# Patient Record
Sex: Male | Born: 1955 | Race: Black or African American | Hispanic: No | Marital: Single | Smoking: Never smoker
Health system: Southern US, Community
[De-identification: ages and names within clinical notes are randomized; demographics above are authoritative.]

## PROBLEM LIST (undated history)

## (undated) ENCOUNTER — Ambulatory Visit (HOSPITAL_COMMUNITY): Admission: EM | Payer: Self-pay

## (undated) DIAGNOSIS — E785 Hyperlipidemia, unspecified: Secondary | ICD-10-CM

## (undated) DIAGNOSIS — N186 End stage renal disease: Secondary | ICD-10-CM

## (undated) DIAGNOSIS — K449 Diaphragmatic hernia without obstruction or gangrene: Secondary | ICD-10-CM

## (undated) DIAGNOSIS — J45909 Unspecified asthma, uncomplicated: Secondary | ICD-10-CM

## (undated) DIAGNOSIS — N529 Male erectile dysfunction, unspecified: Secondary | ICD-10-CM

## (undated) DIAGNOSIS — N189 Chronic kidney disease, unspecified: Secondary | ICD-10-CM

## (undated) DIAGNOSIS — M549 Dorsalgia, unspecified: Secondary | ICD-10-CM

## (undated) DIAGNOSIS — K279 Peptic ulcer, site unspecified, unspecified as acute or chronic, without hemorrhage or perforation: Secondary | ICD-10-CM

## (undated) DIAGNOSIS — A048 Other specified bacterial intestinal infections: Secondary | ICD-10-CM

## (undated) DIAGNOSIS — I10 Essential (primary) hypertension: Secondary | ICD-10-CM

## (undated) DIAGNOSIS — D649 Anemia, unspecified: Secondary | ICD-10-CM

## (undated) DIAGNOSIS — S0285XA Fracture of orbit, unspecified, initial encounter for closed fracture: Secondary | ICD-10-CM

## (undated) HISTORY — DX: Peptic ulcer, site unspecified, unspecified as acute or chronic, without hemorrhage or perforation: K27.9

## (undated) HISTORY — DX: Other specified bacterial intestinal infections: A04.8

## (undated) HISTORY — PX: COLONOSCOPY: SHX174

## (undated) HISTORY — DX: Diaphragmatic hernia without obstruction or gangrene: K44.9

## (undated) HISTORY — DX: Male erectile dysfunction, unspecified: N52.9

## (undated) HISTORY — DX: Fracture of orbit, unspecified, initial encounter for closed fracture: S02.85XA

## (undated) HISTORY — DX: Hyperlipidemia, unspecified: E78.5

## (undated) HISTORY — DX: Unspecified asthma, uncomplicated: J45.909

## (undated) HISTORY — DX: Anemia, unspecified: D64.9

---

## 1998-03-12 ENCOUNTER — Emergency Department (HOSPITAL_COMMUNITY): Admission: EM | Admit: 1998-03-12 | Discharge: 1998-03-12 | Payer: Self-pay | Admitting: Emergency Medicine

## 1999-05-31 ENCOUNTER — Inpatient Hospital Stay (HOSPITAL_COMMUNITY): Admission: EM | Admit: 1999-05-31 | Discharge: 1999-06-01 | Payer: Self-pay | Admitting: Emergency Medicine

## 1999-05-31 ENCOUNTER — Encounter: Payer: Self-pay | Admitting: Emergency Medicine

## 2000-11-24 ENCOUNTER — Emergency Department (HOSPITAL_COMMUNITY): Admission: EM | Admit: 2000-11-24 | Discharge: 2000-11-24 | Payer: Self-pay | Admitting: Emergency Medicine

## 2001-02-10 ENCOUNTER — Encounter: Payer: Self-pay | Admitting: Emergency Medicine

## 2001-02-10 ENCOUNTER — Emergency Department (HOSPITAL_COMMUNITY): Admission: EM | Admit: 2001-02-10 | Discharge: 2001-02-10 | Payer: Self-pay | Admitting: Emergency Medicine

## 2001-09-13 ENCOUNTER — Encounter (INDEPENDENT_AMBULATORY_CARE_PROVIDER_SITE_OTHER): Payer: Self-pay | Admitting: *Deleted

## 2001-09-13 ENCOUNTER — Ambulatory Visit (HOSPITAL_COMMUNITY): Admission: RE | Admit: 2001-09-13 | Discharge: 2001-09-13 | Payer: Self-pay | Admitting: Neurosurgery

## 2001-09-13 ENCOUNTER — Encounter: Payer: Self-pay | Admitting: Neurosurgery

## 2004-04-15 ENCOUNTER — Ambulatory Visit: Payer: Self-pay | Admitting: Internal Medicine

## 2004-04-18 ENCOUNTER — Ambulatory Visit: Payer: Self-pay | Admitting: Internal Medicine

## 2004-04-27 ENCOUNTER — Ambulatory Visit: Payer: Self-pay | Admitting: *Deleted

## 2004-12-20 ENCOUNTER — Ambulatory Visit: Payer: Self-pay | Admitting: Internal Medicine

## 2005-01-03 ENCOUNTER — Emergency Department (HOSPITAL_COMMUNITY): Admission: EM | Admit: 2005-01-03 | Discharge: 2005-01-03 | Payer: Self-pay | Admitting: Emergency Medicine

## 2005-03-27 ENCOUNTER — Ambulatory Visit: Payer: Self-pay | Admitting: Internal Medicine

## 2005-04-10 ENCOUNTER — Ambulatory Visit: Payer: Self-pay | Admitting: Internal Medicine

## 2005-05-10 ENCOUNTER — Emergency Department (HOSPITAL_COMMUNITY): Admission: EM | Admit: 2005-05-10 | Discharge: 2005-05-10 | Payer: Self-pay | Admitting: Emergency Medicine

## 2005-05-10 ENCOUNTER — Ambulatory Visit: Payer: Self-pay | Admitting: Internal Medicine

## 2005-11-10 ENCOUNTER — Ambulatory Visit: Payer: Self-pay | Admitting: Internal Medicine

## 2006-07-23 ENCOUNTER — Ambulatory Visit: Payer: Self-pay | Admitting: Internal Medicine

## 2006-07-30 ENCOUNTER — Ambulatory Visit: Payer: Self-pay | Admitting: Internal Medicine

## 2006-10-10 ENCOUNTER — Ambulatory Visit: Payer: Self-pay | Admitting: Internal Medicine

## 2007-03-13 ENCOUNTER — Encounter (INDEPENDENT_AMBULATORY_CARE_PROVIDER_SITE_OTHER): Payer: Self-pay | Admitting: *Deleted

## 2007-03-26 DIAGNOSIS — E785 Hyperlipidemia, unspecified: Secondary | ICD-10-CM

## 2007-03-26 DIAGNOSIS — E118 Type 2 diabetes mellitus with unspecified complications: Secondary | ICD-10-CM

## 2008-01-21 ENCOUNTER — Ambulatory Visit: Payer: Self-pay | Admitting: Internal Medicine

## 2008-01-21 DIAGNOSIS — J45909 Unspecified asthma, uncomplicated: Secondary | ICD-10-CM | POA: Insufficient documentation

## 2008-01-21 DIAGNOSIS — R809 Proteinuria, unspecified: Secondary | ICD-10-CM | POA: Insufficient documentation

## 2008-01-21 DIAGNOSIS — I10 Essential (primary) hypertension: Secondary | ICD-10-CM | POA: Insufficient documentation

## 2008-01-21 DIAGNOSIS — E1149 Type 2 diabetes mellitus with other diabetic neurological complication: Secondary | ICD-10-CM | POA: Insufficient documentation

## 2008-01-21 LAB — CONVERTED CEMR LAB
Blood Glucose, Fingerstick: 131
Hgb A1c MFr Bld: 5.6 %

## 2008-02-05 ENCOUNTER — Ambulatory Visit: Payer: Self-pay | Admitting: Internal Medicine

## 2008-02-11 ENCOUNTER — Ambulatory Visit: Payer: Self-pay | Admitting: Internal Medicine

## 2008-02-18 LAB — CONVERTED CEMR LAB
BUN: 33 mg/dL — ABNORMAL HIGH (ref 6–23)
CO2: 20 meq/L (ref 19–32)
Calcium: 9.4 mg/dL (ref 8.4–10.5)
Chloride: 107 meq/L (ref 96–112)
Cholesterol: 147 mg/dL (ref 0–200)
Creatinine, Ser: 1.86 mg/dL — ABNORMAL HIGH (ref 0.40–1.50)
Glucose, Bld: 76 mg/dL (ref 70–99)
HDL: 52 mg/dL (ref >39)
Hemoglobin: 11.3 g/dL — ABNORMAL LOW (ref 13.0–17.0)
Lymphocytes Relative: 26 % (ref 12–46)
Monocytes Absolute: 0.6 10*3/uL (ref 0.1–1.0)
Monocytes Relative: 11 % (ref 3–12)
Neutro Abs: 3.1 10*3/uL (ref 1.7–7.7)
RBC: 4.08 M/uL — ABNORMAL LOW (ref 4.22–5.81)
Total CHOL/HDL Ratio: 2.8
Triglycerides: 62 mg/dL (ref <150)

## 2008-02-24 ENCOUNTER — Ambulatory Visit: Payer: Self-pay | Admitting: Internal Medicine

## 2008-02-25 ENCOUNTER — Encounter (INDEPENDENT_AMBULATORY_CARE_PROVIDER_SITE_OTHER): Payer: Self-pay | Admitting: Internal Medicine

## 2008-03-11 ENCOUNTER — Encounter (INDEPENDENT_AMBULATORY_CARE_PROVIDER_SITE_OTHER): Payer: Self-pay | Admitting: Internal Medicine

## 2008-03-11 DIAGNOSIS — D509 Iron deficiency anemia, unspecified: Secondary | ICD-10-CM

## 2008-03-11 LAB — CONVERTED CEMR LAB
Iron: 53 ug/dL (ref 42–165)
Saturation Ratios: 12 % — ABNORMAL LOW (ref 20–55)
Vitamin B-12: 351 pg/mL (ref 211–911)

## 2008-04-19 ENCOUNTER — Telehealth (INDEPENDENT_AMBULATORY_CARE_PROVIDER_SITE_OTHER): Payer: Self-pay | Admitting: Internal Medicine

## 2008-07-02 ENCOUNTER — Emergency Department (HOSPITAL_COMMUNITY): Admission: EM | Admit: 2008-07-02 | Discharge: 2008-07-03 | Payer: Self-pay | Admitting: Emergency Medicine

## 2008-07-24 ENCOUNTER — Encounter (INDEPENDENT_AMBULATORY_CARE_PROVIDER_SITE_OTHER): Payer: Self-pay | Admitting: Internal Medicine

## 2008-08-09 ENCOUNTER — Encounter (INDEPENDENT_AMBULATORY_CARE_PROVIDER_SITE_OTHER): Payer: Self-pay | Admitting: Internal Medicine

## 2009-05-12 ENCOUNTER — Ambulatory Visit: Payer: Self-pay | Admitting: Internal Medicine

## 2009-05-12 DIAGNOSIS — R1013 Epigastric pain: Secondary | ICD-10-CM

## 2009-05-16 ENCOUNTER — Ambulatory Visit (HOSPITAL_COMMUNITY): Admission: RE | Admit: 2009-05-16 | Discharge: 2009-05-16 | Payer: Self-pay | Admitting: Internal Medicine

## 2009-05-18 ENCOUNTER — Encounter (INDEPENDENT_AMBULATORY_CARE_PROVIDER_SITE_OTHER): Payer: Self-pay | Admitting: Internal Medicine

## 2009-05-18 DIAGNOSIS — K859 Acute pancreatitis without necrosis or infection, unspecified: Secondary | ICD-10-CM | POA: Insufficient documentation

## 2009-05-18 DIAGNOSIS — M169 Osteoarthritis of hip, unspecified: Secondary | ICD-10-CM

## 2009-05-18 LAB — CONVERTED CEMR LAB
ALT: 8 units/L (ref 0–53)
AST: 12 units/L (ref 0–37)
Alkaline Phosphatase: 61 units/L (ref 39–117)
Basophils Relative: 1 % (ref 0–1)
Chloride: 106 meq/L (ref 96–112)
Creatinine, Ser: 1.88 mg/dL — ABNORMAL HIGH (ref 0.40–1.50)
Eosinophils Absolute: 0.6 10*3/uL (ref 0.0–0.7)
Lymphs Abs: 1.3 10*3/uL (ref 0.7–4.0)
MCV: 92.4 fL (ref 78.0–100.0)
Neutrophils Relative %: 56 % (ref 43–77)
Platelets: 355 10*3/uL (ref 150–400)
Total Bilirubin: 0.4 mg/dL (ref 0.3–1.2)
WBC: 5.9 10*3/uL (ref 4.0–10.5)

## 2009-05-19 ENCOUNTER — Encounter (INDEPENDENT_AMBULATORY_CARE_PROVIDER_SITE_OTHER): Payer: Self-pay | Admitting: *Deleted

## 2009-06-07 ENCOUNTER — Telehealth (INDEPENDENT_AMBULATORY_CARE_PROVIDER_SITE_OTHER): Payer: Self-pay | Admitting: Internal Medicine

## 2009-06-17 ENCOUNTER — Encounter (INDEPENDENT_AMBULATORY_CARE_PROVIDER_SITE_OTHER): Payer: Self-pay | Admitting: *Deleted

## 2009-07-13 ENCOUNTER — Ambulatory Visit: Payer: Self-pay | Admitting: Internal Medicine

## 2009-07-13 LAB — CONVERTED CEMR LAB: Hgb A1c MFr Bld: 6.1 %

## 2009-07-19 ENCOUNTER — Ambulatory Visit (HOSPITAL_COMMUNITY): Admission: RE | Admit: 2009-07-19 | Discharge: 2009-07-19 | Payer: Self-pay | Admitting: Internal Medicine

## 2009-07-27 ENCOUNTER — Ambulatory Visit: Payer: Self-pay | Admitting: Internal Medicine

## 2009-07-27 LAB — CONVERTED CEMR LAB
Cholesterol: 162 mg/dL (ref 0–200)
HDL: 60 mg/dL (ref >39)
Total CHOL/HDL Ratio: 2.7
Triglycerides: 60 mg/dL (ref <150)

## 2009-07-29 ENCOUNTER — Ambulatory Visit (HOSPITAL_COMMUNITY): Admission: RE | Admit: 2009-07-29 | Discharge: 2009-07-29 | Payer: Self-pay | Admitting: Internal Medicine

## 2009-07-30 ENCOUNTER — Telehealth (INDEPENDENT_AMBULATORY_CARE_PROVIDER_SITE_OTHER): Payer: Self-pay | Admitting: Internal Medicine

## 2009-08-30 ENCOUNTER — Ambulatory Visit: Payer: Self-pay | Admitting: Internal Medicine

## 2009-08-30 ENCOUNTER — Encounter (INDEPENDENT_AMBULATORY_CARE_PROVIDER_SITE_OTHER): Payer: Self-pay | Admitting: Internal Medicine

## 2009-11-16 ENCOUNTER — Telehealth (INDEPENDENT_AMBULATORY_CARE_PROVIDER_SITE_OTHER): Payer: Self-pay | Admitting: Internal Medicine

## 2009-12-15 ENCOUNTER — Telehealth (INDEPENDENT_AMBULATORY_CARE_PROVIDER_SITE_OTHER): Payer: Self-pay | Admitting: Internal Medicine

## 2009-12-29 ENCOUNTER — Ambulatory Visit: Payer: Self-pay | Admitting: Internal Medicine

## 2009-12-29 DIAGNOSIS — R1032 Left lower quadrant pain: Secondary | ICD-10-CM | POA: Insufficient documentation

## 2009-12-29 DIAGNOSIS — N529 Male erectile dysfunction, unspecified: Secondary | ICD-10-CM

## 2009-12-30 ENCOUNTER — Telehealth (INDEPENDENT_AMBULATORY_CARE_PROVIDER_SITE_OTHER): Payer: Self-pay | Admitting: Internal Medicine

## 2010-01-19 ENCOUNTER — Telehealth (INDEPENDENT_AMBULATORY_CARE_PROVIDER_SITE_OTHER): Payer: Self-pay | Admitting: *Deleted

## 2010-07-17 ENCOUNTER — Encounter: Payer: Self-pay | Admitting: Internal Medicine

## 2010-07-28 NOTE — Progress Notes (Signed)
Summary: form to filled out//gk   Phone Note Call from Patient Call back at [phone]   Summary of Call: Anthony Owens wants the provider complete a little form that needs and mailed back to the pt address.  The pt needs by August 1st.  Mulberry MD Initial call taken by: Alexis Goodell,  January 19, 2010 2:06 PM  Follow-up for Phone Call        put in your refill slot Follow-up by: Roland Earl,  January 19, 2010 2:07 PM  Additional Follow-up for Phone Call Additional follow up Details #1::        The form is to be filled out by Anthony Owens.  If he would like, we can give him a copy of his medication list.   He also should sign a release of information so we can send his records --once that is done, please fax records to [facility] Additional Follow-up by: Mack Hook MD,  January 20, 2010 8:50 AM    Additional Follow-up for Phone Call Additional follow up Details #2::    PT NEVER CALLED [place] BACK, SO I MAIL THE INFORMATION TO HIM Follow-up by: Roland Earl,  February 09, 2010 12:23 PM

## 2010-07-28 NOTE — Letter (Signed)
Summary: TEST ORDER FORM//ULTRAOOUND//PANCREATITIS/ACUTE//APPT DATE & TIM  TEST ORDER FORM//ULTRAOOUND//PANCREATITIS/ACUTE//APPT DATE & TIME   Imported By: Roland Earl 07/20/2009 15:34:49  _____________________________________________________________________  External Attachment:    Type:   Image     Comment:   External Document

## 2010-07-28 NOTE — Progress Notes (Signed)
Summary: REFILL ON VIAGRA   Phone Note Call from Patient Call back at Home Phone [phone]   Reason for Call: Refill Medication Summary of Call: Anthony Owens PT. [name] SAYS THAT HE WAS TOLD THAT HE WILL NEED TO GET HIS VIAGRA AT AN OUTSIDE PHARMACY. HE WANTS TO KNOW IF YOU CAN CALL IT INTO [facility] ON [address] AND HE WANTS TO KNOW IF YOU WILL INCREASE HIS QUANITY FROM 50 TO 100. Initial call taken by: Roberto Scales,  Nov 16, 2009 11:05 AM  Follow-up for Phone Call        fwd to Dr. Amil Amen for refill [name]. Follow-up by: Shellia Carwin CMA,  Nov 16, 2009 12:54 PM  Additional Follow-up for Phone Call Additional follow up Details #1::        Need more info (specific info) on why he wants it increased to 100 mg (assuming he meant the dose and not the quantity as we do not dispense that amt) Additional Follow-up by: Mack Hook MD,  Nov 17, 2009 1:58 PM    Additional Follow-up for Phone Call Additional follow up Details #2::    Left message with girlfriend for pt. to return call.  Sherian Maroon RN  Nov 18, 2009 12:45 PM  Please hold onto note until  you receive more information--then forward back  Mack Hook MD  Nov 20, 2009 5:43 PM   No answer, no machine Bridgett Larsson RN  Nov 23, 2009 5:18 PM  spoke with house aide pt not there she advised I should  call back early am Leslie  November 25, 2009 4:20 PM  Pt. states 100 mg works better he has been taking 2 of the 50 mg tabs.    Follow-up by: Bridgett Larsson RN,  November 26, 2009 9:18 AM  Additional Follow-up for Phone Call Additional follow up Details #3:: Details for Additional Follow-up Action Taken: Tried to call--left message with a male who answered phone to call me back.  Need more specifics.  Mack Hook MD  November 26, 2009 5:25 PM   Called again--number is being changed per recording and no new number given  Mack Hook MD  December 03, 2009 8:40 AM  Called again--number changed to  [phone], but that phone number has also been disconnected.  Mack Hook MD  December 13, 2009 8:52 AM

## 2010-07-28 NOTE — Progress Notes (Signed)
Summary: WANT LAB AND CT RESULTS   Phone Note Call from Patient Call back at Home Phone [phone]   Reason for Call: Lab or Test Results Summary of Call: Anthony Owens PT. Anthony Owens WANTS TO KNOW [facility] BLOOD WORK RESULTS AND HIS CT SCAN RESULTS Initial call taken by: Roberto Scales,  July 30, 2009 2:49 PM  Follow-up for Phone Call        Pt notified. Follow-up by: Shellia Carwin CMA,  July 30, 2009 3:00 PM

## 2010-07-28 NOTE — Progress Notes (Signed)
Summary: clarify rx   Phone Note From Pharmacy   Summary of Call: Colman Cater called from the pharmacy and wanted to verify pt's albuterol rx,  they received rx for nebulizer, but directions were for a inhaler.  Per Nicki Reaper go ahead and give inhaler and check with Dr.  Amil Amen on Friday to verify. Initial call taken by: Shellia Carwin CMA,  December 30, 2009 11:08 AM  Follow-up for Phone Call        Tiffany--can you call pt. and see if he has a nebulizer?  Was just refilling what was already in medication list--thought he described using an inhaler and not a nebulizer. If he was using an inhaler and not nebulizer-please change in medication list.  Thanks Follow-up by: Mack Hook MD,  December 30, 2009 3:48 PM  Additional Follow-up for Phone Call Additional follow up Details #1::        Pt states he does have a nebulizer, but does not actually need any albuterol at all, he says he has a lot of it for the neb and he has a lot of inhalers also. Additional Follow-up by: Shellia Carwin CMA,  December 30, 2009 4:02 PM    Additional Follow-up for Phone Call Additional follow up Details #2::    Okay--let Colman Cater know please Follow-up by: Mack Hook MD,  January 16, 2010 5:20 PM  Additional Follow-up for Phone Call Additional follow up Details #3:: Details for Additional Follow-up Action Taken: Done. Additional Follow-up by: Shellia Carwin CMA,  January 17, 2010 4:35 PM

## 2010-07-28 NOTE — Assessment & Plan Note (Signed)
Summary: RENEW MEDICATIONS//GK   Vital Signs:  Patient profile:   55 year old male Weight:      241.1 pounds Temp:     97.2 degrees F oral Pulse rate:   105 / minute Pulse rhythm:   regular Resp:     20 per minute BP sitting:   123 / 74  (left arm) Cuff size:   large  Vitals Entered By: Isla Pence (December 29, 2009 2:09 PM) CC: stomach upset some days he can eat some days he can't....medication refills Is Patient Diabetic? Yes Pain Assessment Patient in pain? no      CBG Result 159 CBG Device ID A  Does patient need assistance? Functional Status Self care Ambulation Normal   CC:  stomach upset some days he can eat some days he can't....medication refills.  History of Present Illness: 1.  ED:  Sent in Rx last week for Viagra--pt. states 50 mg does not allow for a hard enough erection for intercourse--discussed they were filled so he may use 2 tabs with each dose.  2.  Left lower abdominal rumbling.  Sometimes associated with nausea.  Sometimes has some cramping with this.  If has a bowel movement, feels better.  Lasts about 30 minutes and then gone.  Has been occuring for about 2 months.    3.  DM:  Sugars generally running 145-150.  No definite polydipsia or polyuria.  Pt. nonfasting today.  Foot exam done in January.  Did have Retasure done since last visit.  Later, pt. states he has been out of one of his ICP meds--sounds like Actos--for at least 2 months  4.  Hypertension:  tolerating meds.   No swelling of ankles.  No chest pain.  Allergies (verified): No Known Drug Allergies  Physical Exam  General:  Obese, NAD Lungs:  Normal respiratory effort, chest expands symmetrically. Lungs are clear to auscultation, no crackles or wheezes. Heart:  Normal rate and regular rhythm. S1 and S2 normal without gallop, murmur, click, rub or other extra sounds.  Radial pulses normal and equal. Abdomen:  Bowel sounds positive,abdomen soft and non-tender without masses,  organomegaly or hernias noted. Extremities:  No edema   Impression & Recommendations:  Problem # 1:  ESSENTIAL HYPERTENSION (ICD-401.9) Controlled His updated medication list for this problem includes:    Hydrochlorothiazide 25 Mg Tabs (Hydrochlorothiazide) .Marland Kitchen... 1 tab by mouth daily    Lisinopril 40 Mg Tabs (Lisinopril) .Marland Kitchen... 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 1 tab by mouth daily    Glucophage 850 Mg Tabs (Metformin hcl) .Marland Kitchen... 1 tab by mouth three times a day    Glucotrol 5 Mg Tabs (Glipizide) .Marland Kitchen... 1 tab by mouth daily    Lisinopril 40 Mg Tabs (Lisinopril) .Marland Kitchen... 1 tab by mouth daily  Problem # 3:  DYSLIPIDEMIA (ICD-272.4)  The following medications were removed from the medication list:    Zetia 10 Mg Tabs (Ezetimibe) .Marland Kitchen... 1 tab by mouth daily His updated medication list for this problem includes:    Lopid 600 Mg Tabs (Gemfibrozil) .Marland Kitchen... 1 tab by mouth two times a day  Problem # 4:  ERECTILE DYSFUNCTION, ORGANIC (ICD-607.84)  His updated medication list for this problem includes:  Viagra 50 Mg Tabs (Sildenafil citrate) .Marland Kitchen... 2 tabs by mouth before activity as needed  Problem # 5:  ABDOMINAL PAIN, LEFT LOWER QUADRANT (ICD-789.04) Not really having pain --just noise and mild cramping.  To bring in Hemoccult cards completed with labs Exam unremarkable.  Complete Medication List: 1)  Viagra 50 Mg Tabs (Sildenafil citrate) .... 2 tabs by mouth before activity as needed 2)  Actos 45 Mg Tabs (Pioglitazone hcl) .Marland Kitchen.. 1 tab by mouth daily 3)  Albuterol Sulfate (2.5 Mg/37ml) 0.083% Nebu (Albuterol sulfate) .... 2 puffs q6h as needed wheeze 4)  Flovent Hfa 110 Mcg/act Aero (Fluticasone propionate  hfa) .Marland Kitchen.. 1 puff two times a  day 5)  Glucophage 850 Mg Tabs (Metformin hcl) .Marland Kitchen.. 1 tab by mouth three times a day 6)  Hydrochlorothiazide 25 Mg Tabs (Hydrochlorothiazide) .Marland Kitchen.. 1 tab by mouth daily 7)  Lopid 600 Mg Tabs (Gemfibrozil) .Marland Kitchen.. 1 tab by mouth two times a day 8)  Glucotrol 5 Mg Tabs (Glipizide) .Marland Kitchen.. 1 tab by mouth daily 9)  Lisinopril 40 Mg Tabs (Lisinopril) .Marland Kitchen.. 1 tab by mouth daily 10)  Famotidine 40 Mg Tabs (Famotidine) .Marland Kitchen.. 1 tab by mouth at bedtime on empty stomach  Other Orders: Capillary Blood Glucose/CBG RC:[id number])  Patient Instructions: 1)  Fasting labs in next 2 weeks:  FLP, CBC, CMET, UA, urine microalbumin, PSA, A1C 2)  Follow up with Dr. Amil Amen in 4 months-5 months--CPE Prescriptions: FAMOTIDINE 40 MG TABS (FAMOTIDINE) 1 tab by mouth at bedtime on empty stomach  #30 x 2   Entered and Authorized by:   Mack Hook MD   Signed by:   Mack Hook MD on 12/29/2009   Method used:   Faxed to ...       [facility] (retail)       [place], [place]  [address]       Ph: [phone] [place]       Fax: [phone]   RxID:   [id number] LISINOPRIL 40 MG TABS (LISINOPRIL) 1 tab by mouth daily  #30 x 11   Entered and Authorized by:   Mack Hook MD   Signed by:   Mack Hook MD on 12/29/2009   Method used:   Faxed to ...       [facility] (retail)       [place], [place]  [address]       Ph: [phone]       Fax: [phone]   RxID:   [id number] GLUCOTROL 5 MG  TABS (GLIPIZIDE) 1 tab by mouth daily  #30 x 11   Entered and Authorized by:   Mack Hook MD   Signed by:   Mack Hook MD on 12/29/2009   Method used:   Faxed to ...       [facility] (retail)       [place],   [address]       Ph: [phone]       Fax: [phone]   RxID:   [id number] LOPID 600 MG   TABS (GEMFIBROZIL) 1 tab by mouth two times a day  #60 x 11   Entered and Authorized by:   Mack Hook MD   Signed by:   Mack Hook MD on 12/29/2009   Method used:   Faxed to [facility]  [facility] (retail)       [place], [place]  [address]       Ph: [phone] [address]       Fax: [phone]   RxID:   [id number] HYDROCHLOROTHIAZIDE 25 MG  TABS (HYDROCHLOROTHIAZIDE) 1 tab by mouth daily  #30 x 11   Entered and Authorized by:   Mack Hook MD   Signed by:   Mack Hook MD on 12/29/2009   Method used:   Faxed to ...       [facility] (retail)       [place], [place]  [address]       Ph: [phone] [place]       Fax: [phone]   RxID:   [id number] GLUCOPHAGE 850 MG  TABS (METFORMIN HCL) 1 tab by mouth three times a day  #90 x 11   Entered and Authorized by:   Mack Hook MD   Signed by:   Mack Hook MD on 12/29/2009   Method used:   Faxed to ...       [facility] (retail)       [address] [place], [place]  [address]       Ph: [phone] [place]       Fax: [phone]   RxID:   [id number] FLOVENT HFA 110 MCG/ACT  AERO (FLUTICASONE PROPIONATE  HFA) 1 puff two times a day  #1 x 11   Entered and Authorized by:   Mack Hook MD   Signed by:   Mack Hook MD on 12/29/2009   Method used:   Faxed to ...       [facility] (retail)       [place], [place]  [address]       Ph: [phone]       Fax: [phone]   RxID:   [id number] ALBUTEROL SULFATE (2.5 MG/3ML) 0.083%  NEBU (ALBUTEROL SULFATE) 2 puffs q6h as needed wheeze  #1 x 0   Entered and Authorized by:   Mack Hook MD   Signed by:   Mack Hook MD on 12/29/2009   Method used:   Faxed to ...       [facility] (retail)       [place],   [address]       Ph: [phone] [place]       Fax: [phone]   RxID:   [id number] ACTOS 45 MG  TABS (PIOGLITAZONE HCL) 1 tab by mouth daily  #30 x 11   Entered and Authorized by:   Mack Hook MD   Signed by:   Mack Hook MD on 12/29/2009   Method used:   Faxed to ...       [facility] (retail)       [place],   [address]       Ph: [phone] [place]       Fax: [phone]   RxID:   [id number]   Last LDL:  90 (07/27/2009 9:38:00 PM)

## 2010-07-28 NOTE — Letter (Signed)
Summary: REFERRAL//PHYSICAL THERAPY  REFERRAL//PHYSICAL THERAPY   Imported By: Roland Earl 07/07/2009 15:45:10  _____________________________________________________________________  External Attachment:    Type:   Image     Comment:   External Document

## 2010-07-28 NOTE — Letter (Signed)
Summary: [facility]  [facility]   Imported ByRoland Earl 05/25/2010 15:54:54  _____________________________________________________________________  External Attachment:    Type:   Image     Comment:   External Document

## 2010-07-28 NOTE — Assessment & Plan Note (Signed)
Summary: follow up with Dr Amil Amen in 2 months/gk   Vital Signs:  Patient profile:   55 year old male Height:      70 inches Weight:      243 pounds BMI:     34.99 Temp:     97.3 degrees F oral Pulse rate:   92 / minute Pulse rhythm:   regular BP sitting:   128 / 85  (left arm) Cuff size:   large  Vitals Entered By: Sharon Seller  (July 13, 2009 11:32 AM) CC: Pt here for 67mth followup, pt states he has been fine, but he wants to talk about getting a test done to see if he has pancreatic cancer Is Patient Diabetic? Yes Pain Assessment Patient in pain? no      CBG Result 86  Does patient need assistance? Functional Status Self care Ambulation Normal   CC:  Pt here for 35mth followup, pt states he has been fine, and but he wants to talk about getting a test done to see if he has pancreatic cancer.  History of Present Illness: 1.  Epigastric Pain:  Difficult historian, but sounds like has not had pain since before last seen.  Pt. had mildly elevated amylase and lipase on labs after last seen.  Was set up with abdominal ultrasound, but unable to contact pt.  He did receive the 2 letters sent out to contact us, but did not call--did not understand why he needed to call.  Has a working phone now.  Pt. states he will drink 2-3 beers while watching a football game on a weekend, but that is the extent of it.  He is concerned regarding pancreatic cancer as he watched a program recently and felt he has several of the symptoms.  Pt. has gained weight since last seen.  2.  Low back pain--not high behind pancreas--no associated with previous epigastric pain ever that he can recall.  Did not get call from PT, so did not go.  3.  Health maintenance issues:  nonfasting today.  Has not had fasting labs in some time.  4.  DM:  has not had eye check in over 1 year--[facility] Sherlean [facility] at [facility].  Would like to have Retasure secondary to cost.  Sugars have been good at home.  UP to date on  immunizations.  Allergies (verified): No Known Drug Allergies  Physical Exam  Lungs:  Normal respiratory effort, chest expands symmetrically. Lungs are clear to auscultation, no crackles or wheezes. Heart:  Normal rate and regular rhythm. S1 and S2 normal without gallop, murmur, click, rub or other extra sounds.  Radial pulses normal and equal Abdomen:  Bowel sounds positive,abdomen soft and non-tender without masses, organomegaly or hernias noted.  Diabetes Management Exam:    Foot Exam (with socks and/or shoes not present):       Sensory-Monofilament:          Left foot: normal          Right foot: normal   Impression & Recommendations:  Problem # 1:  PANCREATITIS, ACUTE (ICD-577.0) Needs further eval. 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 Immunizations up to date. Retasure His updated medication list for this problem includes:    Actos 45 Mg Tabs (Pioglitazone hcl) .Marland Kitchen... 1 tab by mouth daily    Glucophage 850 Mg Tabs (Metformin hcl) .Marland Kitchen... 1 tab by mouth three times a day    Glucotrol 5  Mg Tabs (Glipizide) .Marland Kitchen... 1 tab by mouth daily    Lisinopril 40 Mg Tabs (Lisinopril) .Marland Kitchen... 1 tab by mouth daily  Problem # 3:  LEFT BACK PAIN, LUMBAR, WITH RADICULOPATHY (ICD-724.4) Symptoms essentially resolved Xrays were normal. Did not go to PT--suspect PT was unable to contact The following medications were removed from the medication list:    Hydrocodone-acetaminophen 5-500 Mg Tabs (Hydrocodone-acetaminophen) .Marland Kitchen... 1 tab by mouth every 6 hours as needed for back pain--no refills    Cyclobenzaprine Hcl 10 Mg Tabs (Cyclobenzaprine hcl) .Marland Kitchen... 1 tab by mouth every 8 hours as needed for muscle pain  Complete Medication List: 1)  Viagra 50 Mg Tabs (Sildenafil citrate) 2)  Zetia 10 Mg Tabs (Ezetimibe) .Marland Kitchen.. 1 tab by mouth daily 3)  Actos 45 Mg Tabs (Pioglitazone hcl) .Marland Kitchen.. 1 tab by mouth daily 4)   Albuterol Sulfate (2.5 Mg/39ml) 0.083% Nebu (Albuterol sulfate) .... 2 puffs q6h as needed wheeze 5)  Flovent Hfa 110 Mcg/act Aero (Fluticasone propionate  hfa) .Marland Kitchen.. 1 puff two times a day 6)  Glucophage 850 Mg Tabs (Metformin hcl) .Marland Kitchen.. 1 tab by mouth three times a day 7)  Hydrochlorothiazide 25 Mg Tabs (Hydrochlorothiazide) .Marland Kitchen.. 1 tab by mouth daily 8)  Lopid 600 Mg Tabs (Gemfibrozil) .Marland Kitchen.. 1 tab by mouth two times a day 9)  Glucotrol 5 Mg Tabs (Glipizide) .Marland Kitchen.. 1 tab by mouth daily 10)  Ferrous Sulfate 324 Mg Tabs (Ferrous sulfate) .Marland Kitchen.. 1 tab by mouth daily 11)  Lisinopril 40 Mg Tabs (Lisinopril) .Marland Kitchen.. 1 tab by mouth daily 12)  Famotidine 40 Mg Tabs (Famotidine) .Marland Kitchen.. 1 tab by mouth at bedtime on empty stomach  Other Orders: Capillary Blood Glucose/CBG [id number])  Patient Instructions: 1)  Follow up with Dr. Amil Amen in 4 months DM, Htn, cholesterol 2)  Schedule Retasure 3)  Fasting lab visit in next 2 weeks:  FLP, urine microalbumin,    Vital Signs:  Patient profile:   55 year old male Height:      70 inches Weight:      243 pounds BMI:     34.99 Temp:     97.3 degrees F oral Pulse rate:   92 / minute Pulse rhythm:   regular BP sitting:   128 / 85  (left arm) Cuff size:   large  Vitals Entered By: Sharon Seller  (July 13, 2009 11:32 AM)   Last LDL:                                                 83 (02/11/2008 9:21:00 PM)        Diabetic Foot Exam  Set Next Diabetic Foot Exam here: 07/13/2010   10-g (5.07) Semmes-Weinstein Monofilament Test Performed by: Sharon Seller          Right Foot          Left Foot Visual Inspection               Test Control      normal         normal Site 1         normal         normal Site 2         normal         normal Site 3         normal  normal Site 4         normal         normal Site 5         normal         normal Site 6         normal         normal Site 7         normal         normal Site 8         normal          normal Site 9         normal         normal Site 10         normal         normal  Impression      normal         normal    Laboratory Results   Blood Tests     HGBA1C: 6.1%   (Normal Range: Non-Diabetic - 3-6%   Control Diabetic - 6-8%) CBG Random:: 86mg /dL

## 2010-07-28 NOTE — Progress Notes (Signed)
Summary: Wants his viagra filled  Medications Added VIAGRA 50 MG  TABS (SILDENAFIL CITRATE) 2 tabs by mouth before activity as needed       Phone Note Call from Patient   Summary of Call: pt called trying to figure out why he doesn't have a rx for his viagra.... pt says he gave all the information to the other nurse and he has been waiting for a whole month... pt would like to speak to someone that will give him an answer. he can be reach at [phone] or [phone]... pt states that 100mg  works better than the 50mg  and he has to take two tabs  Initial call taken by: [place] Shannon,  December 15, 2009 3:24 PM  Follow-up for Phone Call        Please see very extensive phone note prior to this one--I need specific information that I have not been able to get other than "the higher dose works bette"r. He also missed his appt. yesterday to discuss. His phone does not work--I have tried calling multiple times.   Follow-up by: Mack Hook MD,  December 15, 2009 7:58 PM  Additional Follow-up for Phone Call Additional follow up Details #1::        States his old doctor had him on 100 mg. and, again, he said it "works better."  Unable to get more information from him, won't discuss how it works or doesn't work.  Asked him to define "better" and he said "no."  Was rather abrupt and not cooperative. Just reiterated that he needs the 100 mg. like he had before.  Sherian Maroon RN  December 16, 2009 3:54 PM  Additional Follow-up by: Sherian Maroon RN,  December 16, 2009 3:55 PM    Additional Follow-up for Phone Call Additional follow up Details #2::    Please let pt know I am refilling, but in future, if he misses an appt., the medication will not be filled.  I am uncertain as to whether the pharmacy will fill as the Viagra is limited at this time. Follow-up by: Mack Hook MD,  December 23, 2009 5:27 PM  New/Updated Medications: VIAGRA 50 MG  TABS (SILDENAFIL CITRATE) 2 tabs by mouth before activity as  needed Prescriptions: VIAGRA 50 MG  TABS (SILDENAFIL CITRATE) 2 tabs by mouth before activity as needed  #20 x 4   Entered and Authorized by:   Mack Hook MD   Signed by:   Mack Hook MD on 12/28/2009   Method used:   Electronically to        [facility] [phone]* (retail)       [place], [place]  [address]       Ph: [phone]       Fax: [phone]   [id number] VIAGRA 50 MG  TABS (SILDENAFIL CITRATE) 2 tabs by mouth before activity as needed  #20 x 4   Entered and Authorized by:   Mack Hook MD   Signed by:   Mack Hook MD on 12/23/2009   Method used:   Faxed to ...       [facility] (retail)       [place], [place]  [address]       Ph: [phone]       Fax: [phone]   RxID:   [id number]  Sent to wrong pharmacy--received note from  Pt. wants to go to [facility] and no longer able to get at [facility]

## 2010-11-11 NOTE — Op Note (Signed)
[place]  Patient:    Anthony Owens, Anthony Owens Visit Number: [id number] MRN: [id number]          Service Type: DSU Location: [place] 08 Attending Physician:  Ashok Pall Dictated by:   Vickki Muff. Christella Noa, M.D. Proc. Date: 09/13/01 Admit Date:  09/13/2001 Discharge Date: 09/13/2001                             Operative Report  PREOPERATIVE DIAGNOSIS:  Cholesterol lowering agent myopathy.  POSTOPERATIVE DIAGNOSIS:  Cholesterol lowering agent myopathy.  PROCEDURE:  Left vastus lateralis muscle biopsy.  COMPLICATIONS:  None.  SURGEON:  Kyle L. Christella Noa, M.D.  ANESTHESIA:  Local with IV sedation.  INDICATIONS:  Anthony Owens is a 55 year old gentleman who has a history of myopathy secondary to a cholesterol lowering agent.  DESCRIPTION OF PROCEDURE:  Anthony Owens is brought to the operating room and placed on the operating table with his left side up.  A pillow was placed between his legs.  His thigh was prepped and he was draped in a sterile fashion.  I infiltrated 10 cc of 0.5% lidocaine into the left thigh.  I made a skin incision using a #15 blade and took this down to the muscular fascia.  I opened that and exposed the vastus lateralis.  I then took two pieces of muscle, one a free piece and the other kept on tension.  I irrigated the wound.  I then reapproximated the muscle and fascia with Vicryl sutures, subcutaneous tissue with Vicryl sutures and the incision with Vicryl sutures. Dermabond was used for a sterile dressing. Dictated by:   Vickki Muff. Christella Noa, M.D.  Attending Physician:  Ashok Pall DD:  09/13/01 TD:  09/16/01 Job: [id number]

## 2010-11-25 DIAGNOSIS — D649 Anemia, unspecified: Secondary | ICD-10-CM

## 2010-11-25 DIAGNOSIS — S0285XA Fracture of orbit, unspecified, initial encounter for closed fracture: Secondary | ICD-10-CM

## 2010-11-25 HISTORY — DX: Fracture of orbit, unspecified, initial encounter for closed fracture: S02.85XA

## 2010-11-25 HISTORY — DX: Anemia, unspecified: D64.9

## 2010-12-06 ENCOUNTER — Emergency Department (HOSPITAL_COMMUNITY): Payer: PRIVATE HEALTH INSURANCE

## 2010-12-06 ENCOUNTER — Inpatient Hospital Stay (HOSPITAL_COMMUNITY)
Admission: EM | Admit: 2010-12-06 | Discharge: 2010-12-10 | DRG: 565 | Disposition: A | Discharge status: Home / Self Care | Payer: PRIVATE HEALTH INSURANCE | Attending: Internal Medicine | Admitting: Internal Medicine

## 2010-12-06 DIAGNOSIS — W1809XA Striking against other object with subsequent fall, initial encounter: Secondary | ICD-10-CM | POA: Diagnosis present

## 2010-12-06 DIAGNOSIS — E860 Dehydration: Secondary | ICD-10-CM | POA: Diagnosis present

## 2010-12-06 DIAGNOSIS — D509 Iron deficiency anemia, unspecified: Secondary | ICD-10-CM | POA: Diagnosis present

## 2010-12-06 DIAGNOSIS — D518 Other vitamin B12 deficiency anemias: Secondary | ICD-10-CM | POA: Diagnosis present

## 2010-12-06 DIAGNOSIS — I10 Essential (primary) hypertension: Secondary | ICD-10-CM | POA: Diagnosis present

## 2010-12-06 DIAGNOSIS — J45909 Unspecified asthma, uncomplicated: Secondary | ICD-10-CM | POA: Diagnosis present

## 2010-12-06 DIAGNOSIS — E119 Type 2 diabetes mellitus without complications: Secondary | ICD-10-CM | POA: Diagnosis present

## 2010-12-06 DIAGNOSIS — S0280XA Fracture of other specified skull and facial bones, unspecified side, initial encounter for closed fracture: Principal | ICD-10-CM | POA: Diagnosis present

## 2010-12-06 DIAGNOSIS — F1010 Alcohol abuse, uncomplicated: Secondary | ICD-10-CM | POA: Diagnosis present

## 2010-12-06 DIAGNOSIS — F1910 Other psychoactive substance abuse, uncomplicated: Secondary | ICD-10-CM | POA: Diagnosis present

## 2010-12-06 DIAGNOSIS — K269 Duodenal ulcer, unspecified as acute or chronic, without hemorrhage or perforation: Secondary | ICD-10-CM | POA: Diagnosis present

## 2010-12-06 DIAGNOSIS — Y92009 Unspecified place in unspecified non-institutional (private) residence as the place of occurrence of the external cause: Secondary | ICD-10-CM

## 2010-12-06 DIAGNOSIS — R195 Other fecal abnormalities: Secondary | ICD-10-CM | POA: Diagnosis present

## 2010-12-06 DIAGNOSIS — Z7982 Long term (current) use of aspirin: Secondary | ICD-10-CM

## 2010-12-06 DIAGNOSIS — N179 Acute kidney failure, unspecified: Secondary | ICD-10-CM | POA: Diagnosis present

## 2010-12-06 LAB — URINALYSIS, ROUTINE W REFLEX MICROSCOPIC
Bilirubin Urine: NEGATIVE
Glucose, UA: NEGATIVE mg/dL
Hgb urine dipstick: NEGATIVE
Ketones, ur: NEGATIVE mg/dL
Specific Gravity, Urine: 1.012 (ref 1.005–1.030)
pH: 5.5 (ref 5.0–8.0)

## 2010-12-06 LAB — BASIC METABOLIC PANEL
BUN: 49 mg/dL — ABNORMAL HIGH (ref 6–23)
CO2: 21 mEq/L (ref 19–32)
Chloride: 104 mEq/L (ref 96–112)
Creatinine, Ser: 3.17 mg/dL — ABNORMAL HIGH (ref 0.4–1.5)
Potassium: 4.8 mEq/L (ref 3.5–5.1)

## 2010-12-06 LAB — CBC
HCT: 18.8 % — ABNORMAL LOW (ref 39.0–52.0)
MCV: 69.6 fL — ABNORMAL LOW (ref 78.0–100.0)
Platelets: 306 10*3/uL (ref 150–400)
RBC: 2.7 MIL/uL — ABNORMAL LOW (ref 4.22–5.81)
WBC: 7.7 10*3/uL (ref 4.0–10.5)

## 2010-12-06 LAB — RAPID URINE DRUG SCREEN, HOSP PERFORMED
Amphetamines: NOT DETECTED
Cocaine: POSITIVE — AB
Opiates: NOT DETECTED
Tetrahydrocannabinol: POSITIVE — AB

## 2010-12-06 LAB — PROTIME-INR: Prothrombin Time: 13.5 seconds (ref 11.6–15.2)

## 2010-12-06 LAB — OCCULT BLOOD, POC DEVICE: Fecal Occult Bld: POSITIVE

## 2010-12-06 LAB — TROPONIN I: Troponin I: <0.3 ng/mL (ref <0.30)

## 2010-12-06 LAB — HEPATIC FUNCTION PANEL
ALT: 13 U/L (ref 0–53)
AST: 21 U/L (ref 0–37)
Albumin: 3.6 g/dL (ref 3.5–5.2)
Bilirubin, Direct: <0.1 mg/dL (ref 0.0–0.3)

## 2010-12-06 LAB — DIFFERENTIAL
Basophils Relative: 0 % (ref 0–1)
Eosinophils Relative: 3 % (ref 0–5)
Lymphocytes Relative: 15 % (ref 12–46)
Neutrophils Relative %: 70 % (ref 43–77)

## 2010-12-06 LAB — CK TOTAL AND CKMB (NOT AT ARMC)
CK, MB: 3.7 ng/mL (ref 0.3–4.0)
Relative Index: 0.8 (ref 0.0–2.5)
Total CK: 492 U/L — ABNORMAL HIGH (ref 7–232)

## 2010-12-06 LAB — LIPASE, BLOOD: Lipase: 108 U/L — ABNORMAL HIGH (ref 11–59)

## 2010-12-06 LAB — SODIUM, URINE, RANDOM: Sodium, Ur: 48 mEq/L

## 2010-12-07 DIAGNOSIS — I517 Cardiomegaly: Secondary | ICD-10-CM

## 2010-12-07 DIAGNOSIS — R55 Syncope and collapse: Secondary | ICD-10-CM

## 2010-12-07 LAB — CBC
MCH: 22.1 pg — ABNORMAL LOW (ref 26.0–34.0)
MCHC: 31 g/dL (ref 30.0–36.0)
Platelets: 278 10*3/uL (ref 150–400)
RBC: 3.17 MIL/uL — ABNORMAL LOW (ref 4.22–5.81)

## 2010-12-07 LAB — IRON AND TIBC: Saturation Ratios: 3 % — ABNORMAL LOW (ref 20–55)

## 2010-12-07 LAB — BASIC METABOLIC PANEL
CO2: 22 mEq/L (ref 19–32)
Calcium: 8.9 mg/dL (ref 8.4–10.5)
GFR calc non Af Amer: 42 mL/min — ABNORMAL LOW (ref >60)
Sodium: 140 mEq/L (ref 135–145)

## 2010-12-07 LAB — LIPID PANEL
Cholesterol: 132 mg/dL (ref 0–200)
Triglycerides: 72 mg/dL (ref <150)

## 2010-12-07 LAB — PROTIME-INR
INR: 1.02 (ref 0.00–1.49)
Prothrombin Time: 13.6 seconds (ref 11.6–15.2)

## 2010-12-07 LAB — PREPARE RBC (CROSSMATCH)

## 2010-12-07 LAB — VITAMIN B12: Vitamin B-12: 244 pg/mL (ref 211–911)

## 2010-12-07 LAB — GLUCOSE, CAPILLARY: Glucose-Capillary: 98 mg/dL (ref 70–99)

## 2010-12-07 LAB — URINE CULTURE

## 2010-12-07 LAB — TSH: TSH: 1.961 u[IU]/mL (ref 0.350–4.500)

## 2010-12-08 LAB — LIPASE, BLOOD: Lipase: 93 U/L — ABNORMAL HIGH (ref 11–59)

## 2010-12-08 LAB — GLUCOSE, CAPILLARY
Glucose-Capillary: 113 mg/dL — ABNORMAL HIGH (ref 70–99)
Glucose-Capillary: 105 mg/dL — ABNORMAL HIGH (ref 70–99)

## 2010-12-08 LAB — COMPREHENSIVE METABOLIC PANEL
Alkaline Phosphatase: 49 U/L (ref 39–117)
BUN: 17 mg/dL (ref 6–23)
Calcium: 8.8 mg/dL (ref 8.4–10.5)
GFR calc Af Amer: >60 mL/min (ref >60)
GFR calc non Af Amer: >60 mL/min (ref >60)
Glucose, Bld: 94 mg/dL (ref 70–99)
Total Protein: 6.1 g/dL (ref 6.0–8.3)

## 2010-12-08 LAB — CBC
HCT: 23.9 % — ABNORMAL LOW (ref 39.0–52.0)
Hemoglobin: 7.6 g/dL — ABNORMAL LOW (ref 13.0–17.0)
MCH: 23.3 pg — ABNORMAL LOW (ref 26.0–34.0)
MCHC: 31.8 g/dL (ref 30.0–36.0)

## 2010-12-08 LAB — PREPARE RBC (CROSSMATCH)
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
## 2010-12-09 ENCOUNTER — Inpatient Hospital Stay (HOSPITAL_COMMUNITY): Payer: PRIVATE HEALTH INSURANCE

## 2010-12-09 ENCOUNTER — Other Ambulatory Visit: Payer: Self-pay | Admitting: Gastroenterology

## 2010-12-09 LAB — GLUCOSE, CAPILLARY
Glucose-Capillary: 81 mg/dL (ref 70–99)
Glucose-Capillary: 76 mg/dL (ref 70–99)

## 2010-12-09 LAB — CROSSMATCH
Antibody Screen: NEGATIVE
Unit division: 0
Unit division: 0
Unit division: 0

## 2010-12-09 LAB — BASIC METABOLIC PANEL
BUN: 9 mg/dL (ref 6–23)
Calcium: 9.1 mg/dL (ref 8.4–10.5)
Creatinine, Ser: 1.13 mg/dL (ref 0.50–1.35)
GFR calc non Af Amer: >60 mL/min (ref >60)
Glucose, Bld: 85 mg/dL (ref 70–99)
Sodium: 140 mEq/L (ref 135–145)

## 2010-12-09 LAB — CBC
HCT: 26.9 % — ABNORMAL LOW (ref 39.0–52.0)
Hemoglobin: 8.4 g/dL — ABNORMAL LOW (ref 13.0–17.0)
MCH: 23.5 pg — ABNORMAL LOW (ref 26.0–34.0)
MCHC: 31.2 g/dL (ref 30.0–36.0)
MCV: 75.1 fL — ABNORMAL LOW (ref 78.0–100.0)
Platelets: 286 K/uL (ref 150–400)
RBC: 3.58 MIL/uL — ABNORMAL LOW (ref 4.22–5.81)
RDW: 19.1 % — ABNORMAL HIGH (ref 11.5–15.5)
WBC: 6.9 K/uL (ref 4.0–10.5)

## 2010-12-09 LAB — PATHOLOGIST SMEAR REVIEW
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
## 2010-12-10 LAB — GLUCOSE, CAPILLARY
Glucose-Capillary: 142 mg/dL — ABNORMAL HIGH (ref 70–99)
Glucose-Capillary: 78 mg/dL (ref 70–99)

## 2010-12-10 LAB — CBC
Platelets: 292 10*3/uL (ref 150–400)
RBC: 3.45 MIL/uL — ABNORMAL LOW (ref 4.22–5.81)
RDW: 19.6 % — ABNORMAL HIGH (ref 11.5–15.5)
WBC: 7.6 10*3/uL (ref 4.0–10.5)

## 2010-12-12 LAB — PROTEIN ELECTROPH W RFLX QUANT IMMUNOGLOBULINS
Beta 2: 6.3 % (ref 3.2–6.5)
Beta Globulin: 6.6 % (ref 4.7–7.2)
Gamma Globulin: 15.3 % (ref 11.1–18.8)
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
## 2011-01-18 NOTE — Op Note (Signed)
  NAMEDERRILL, SURTI NO.:  [id number]  MEDICAL RECORD NO.:  [id number]  LOCATION:  6705                         FACILITY:  [facility]  PHYSICIAN:  Wonda Horner, M.D.   DATE OF BIRTH:  1955/11/10  DATE OF PROCEDURE:  12/09/2010 DATE OF DISCHARGE:                              OPERATIVE REPORT   INDICATIONS FOR PROCEDURE:  Anemia, heme-positive stool.  Informed consent was obtained after explanation of the risks of bleeding, infection, and perforation.  PREMEDICATION: 1. Fentanyl 100 mcg IV. 2. Versed 4 mg IV. 3. Benadryl 25 mg IV.  PROCEDURE:  With the patient in the left lateral decubitus position, the Pentax gastroscope was inserted into the oropharynx and passed into the esophagus.  It was advanced down the esophagus then into the stomach and into the duodenum.  The second portion of the duodenum was normal.  The bulb of the duodenum had edematous mucosa, and there was a 1-cm duodenal bulb ulcer.  The antrum of the stomach showed some erythema with some small erosions.  Biopsy was obtained from the antrum for histology and to look for evidence of H. pylori.  The rest of the stomach looked normal.  The esophagus looked normal.  He tolerated the procedure well without complications.  IMPRESSION:  A 1-cm duodenal bulb ulcer with some erosions also in the antrum of the stomach.  PLAN:  The biopsies will be checked primarily for H. Pylori.  If that is positive, it should be treated.  Otherwise, I would keep this patient on a proton pump inhibitor for treatment of his ulcer.          ______________________________ Wonda Horner, M.D.     SFG/MEDQ  D:  12/09/2010  T:  12/10/2010  Job:  [id number]  cc:   [facility]  Electronically Signed by Anson Fret MD on 01/18/2011 03:42:23 PM

## 2011-01-18 NOTE — Op Note (Signed)
  NAMETRINIDY, LORIG NO.:  [id number]  MEDICAL RECORD NO.:  [id number]  LOCATION:  6705                         FACILITY:  [facility]  PHYSICIAN:  Wonda Horner, M.D.   DATE OF BIRTH:  Jul 07, 1955  DATE OF PROCEDURE:  12/09/2010 DATE OF DISCHARGE:                              OPERATIVE REPORT   INDICATIONS:  Anemia, heme-positive stool.  Informed consent was obtained after explanation of the risks of bleeding, infection and perforation.  PREMEDICATION:  See EGD for total dosage given.  PROCEDURE IN DETAILS:  With the patient in the left lateral decubitus position, a rectal exam was performed and no masses were felt.  The Pentax colonoscope was inserted into the rectum and advanced around the colon into the region of the transverse colon.  The colon became very tortuous as I went further and despite all maneuvers including pressure on the abdomen and turning the patient over on his back, I could not advance the scope beyond the transverse colon.  No lesions were seen in the colon that was examined.  He tolerated the procedure well without complications.  IMPRESSION:  Normal colonoscopy to the transverse colon and air-contrast barium enema to evaluate the more proximal colon.          ______________________________ Wonda Horner, M.D.     SFG/MEDQ  D:  12/09/2010  T:  12/10/2010  Job:  [id number]  Electronically Signed by Anson Fret MD on 01/18/2011 03:42:26 PM

## 2011-01-18 NOTE — Consult Note (Signed)
  NAMEMarland Kitchen  Anthony Owens, Anthony Owens NO.:  [id number]  MEDICAL RECORD NO.:  [id number]  LOCATION:  6705                         FACILITY:  [facility]  PHYSICIAN:  Wonda Horner, M.D.   DATE OF BIRTH:  [birth year]  DATE OF CONSULTATION:  12/07/2010 DATE OF DISCHARGE:                                CONSULTATION   REASON FOR CONSULTATION:  The patient is a 55 year old male who came to the emergency room after falling and hitting the left side of his eye resulting in swelling.  The patient apparently was drinking alcohol and inebriated at that time.  He hit the left side of his face and when he came to the emergency room, had a CT scan of the head, which showed a left orbital fracture.  He was also found to be anemic with a hemoglobin of 5.7 and a hematocrit of 18.8, and had heme-positive stool.  We are asked to see the patient in regards to the anemia and heme-positive stool.  The patient does report that he has had some abdominal pains over the years and reports that he has been evaluated in the past with abdominal ultrasound and CT scan of the abdomen and that he does not think anything specifically was found.  In looking back at a CT scan from February 2011 of the abdomen, it was unremarkable, but some minimal anasarca was described.  The pancreas, liver, spleen, kidneys, adrenals, and gallbladder all looked normal.  The patient denies any vomiting blood or black tarry stools.  He has never had an endoscopy or colonoscopy.  PAST HISTORY: 1. Asthma. 2. Hypertension. 3. Diabetes.  MEDICATIONS:  Actos, aspirin, metformin, a blood pressure medicine, which she does not know the name of.  ALLERGIES:  None known.  SOCIAL HISTORY:  Drinks alcohol.  Does not smoke.  REVIEW OF SYSTEMS:  Negative except for above.  PHYSICAL EXAMINATION:  GENERAL:  He does not appear in any acute distress. HEENT:  He is nonicteric. HEART:  Regular rhythm.  No murmurs. LUNGS:  Clear. ABDOMEN:   Soft, nontender.  No hepatosplenomegaly.  IMPRESSION: 1. Anemia. 2. Heme-positive stool.  PLAN:  I will schedule him for an EGD and colonoscopy to evaluate both the upper and lower GI tracts.          ______________________________ Wonda Horner, M.D.     SFG/MEDQ  D:  12/07/2010  T:  12/08/2010  Job:  [id number]  cc:   [facility]  Electronically Signed by Anson Fret MD on 01/18/2011 03:42:21 PM

## 2011-11-30 ENCOUNTER — Other Ambulatory Visit (HOSPITAL_COMMUNITY): Payer: Self-pay | Admitting: Internal Medicine

## 2011-11-30 DIAGNOSIS — M7989 Other specified soft tissue disorders: Secondary | ICD-10-CM

## 2011-12-04 ENCOUNTER — Other Ambulatory Visit (HOSPITAL_COMMUNITY): Payer: PRIVATE HEALTH INSURANCE

## 2011-12-04 ENCOUNTER — Encounter (HOSPITAL_COMMUNITY): Payer: Self-pay | Admitting: *Deleted

## 2011-12-04 ENCOUNTER — Emergency Department (HOSPITAL_COMMUNITY)
Admission: EM | Admit: 2011-12-04 | Discharge: 2011-12-04 | Disposition: A | Discharge status: Home / Self Care | Payer: PRIVATE HEALTH INSURANCE | Attending: Emergency Medicine | Admitting: Emergency Medicine

## 2011-12-04 DIAGNOSIS — M545 Low back pain, unspecified: Secondary | ICD-10-CM | POA: Insufficient documentation

## 2011-12-04 DIAGNOSIS — E119 Type 2 diabetes mellitus without complications: Secondary | ICD-10-CM | POA: Insufficient documentation

## 2011-12-04 DIAGNOSIS — M5416 Radiculopathy, lumbar region: Secondary | ICD-10-CM

## 2011-12-04 MED ORDER — OXYCODONE-ACETAMINOPHEN 5-325 MG PO TABS: 1.0000 | ORAL_TABLET | ORAL | Status: DC | PRN

## 2011-12-04 MED ORDER — OXYCODONE-ACETAMINOPHEN 5-325 MG PO TABS
2.0000 | ORAL_TABLET | Freq: Once | ORAL | Status: AC
  Administered 2011-12-04: 2 via ORAL
  Filled 2011-12-04: qty 2

## 2011-12-04 NOTE — ED Provider Notes (Signed)
History    This chart was scribed for Sharyon Cable, MD, MD by Rhae Lerner. The patient was seen in room [place] and the patient's care was started at 1:25PM.   CSN: [id number]  Arrival date & time 12/04/11  1220   First MD Initiated Contact with Patient 12/04/11 1320      Chief Complaint  Patient presents with  . Back Pain    Patient is a 56 y.o. male presenting with back pain. The history is provided by the patient.  Back Pain  Pertinent negatives include no fever and no dysuria.   Anthony Owens is a 56 y.o. male who presents to the Emergency Department complaining of moderate lower back painonset today this AM. Pt reports that the pain radiates to his left leg. Pt denies injury. Pain has been constant. Pt reports hx of diabetes. Pt denies dysuria, urinary incontinence, bowel incontinence, increased urinary frequency.  No focal weakness reported No trauma  PMH - diabetes  No past surgical history on file.  No family history on file.  History  Substance Use Topics  . Smoking status: Not on file  . Smokeless tobacco: Not on file  . Alcohol Use: Not on file      Review of Systems  Constitutional: Negative for fever.  Respiratory: Negative for shortness of breath.   Gastrointestinal: Negative for nausea, vomiting and diarrhea.  Genitourinary: Negative for dysuria, urgency, frequency and hematuria.  Musculoskeletal: Positive for back pain. Negative for gait problem.    Allergies  Review of patient's allergies indicates no known allergies.  Home Medications   Current Outpatient Rx  Name Route Sig Dispense Refill  . ACETAMINOPHEN 500 MG PO TABS Oral Take 500 mg by mouth every 6 (six) hours as needed. For pain    . HYDROCHLOROTHIAZIDE 25 MG PO TABS Oral Take 25 mg by mouth daily.    Marland Kitchen PIOGLITAZONE HCL 45 MG PO TABS Oral Take 45 mg by mouth daily.      BP 154/87  Pulse 85  Temp(Src) 98.1 F (36.7 C) (Oral)  Resp 17  SpO2 96%  Physical Exam  Nursing note  and vitals reviewed.  CONSTITUTIONAL: Well developed/well nourished HEAD AND FACE: Normocephalic/atraumatic EYES: EOMI/PERRL ENMT: Mucous membranes moist NECK: supple no meningeal signs SPINE:left paraspinal tenderness, spine is nontender, No bruising/crepitance/stepoffs noted to spine CV: S1/S2 noted, no murmurs/rubs/gallops noted LUNGS: Lungs are clear to auscultation bilaterally, no apparent distress ABDOMEN: soft, nontender, no rebound or guarding GU:no cva tenderness NEURO: Awake/alert, equal distal motor: hip flexion/knee flexion/extension, ankle dorsi/plantar flexion, great toe extension intact bilaterally, no clonus bilaterally, no apparent sensory deficit in any dermatome.  Equal patellar reflex noted.  Pt is able to ambulate. EXTREMITIES: pulses normal, full ROM SKIN: warm, color normal PSYCH: no abnormalities of mood noted; ED Course  Procedures  DIAGNOSTIC STUDIES: Oxygen Saturation is 96% on room air, normal by my interpretation.    COORDINATION OF CARE: 1:30PM EDP discusses pt ED treatment with pt. 1:30PM EDP orders medication: percocet 325 mg   Pt with low back pain with radiation into left LE without neurologic symptoms Well appearing, ambulatory Suspect lumbar radiculopathy He has had imaging previously (CT) that did not show any osseous lesions in back Discussed strict return precautions   The patient appears reasonably screened and/or stabilized for discharge and I doubt any other medical condition or other [facility] requiring further screening, evaluation, or treatment in the ED at this time prior to discharge.     MDM  Nursing notes including past medical history, social history and family history reviewed and considered in documentation Previous records reviewed and considered  I personally performed the services described in this documentation, which was scribed in my presence. The recorded information has been reviewed and considered.            Sharyon Cable, MD 12/04/11 [phone]

## 2011-12-04 NOTE — ED Notes (Signed)
Lt. Lower back pain that radiates into lt. Hip,   Started this am, denies any injuires

## 2011-12-04 NOTE — ED Notes (Signed)
Pt reports family will give him a ride home

## 2011-12-04 NOTE — Discharge Instructions (Signed)

## 2011-12-07 ENCOUNTER — Emergency Department (HOSPITAL_COMMUNITY): Payer: PRIVATE HEALTH INSURANCE

## 2011-12-07 ENCOUNTER — Emergency Department (HOSPITAL_COMMUNITY)
Admission: EM | Admit: 2011-12-07 | Discharge: 2011-12-07 | Disposition: A | Discharge status: Home / Self Care | Payer: PRIVATE HEALTH INSURANCE | Attending: Emergency Medicine | Admitting: Emergency Medicine

## 2011-12-07 ENCOUNTER — Encounter (HOSPITAL_COMMUNITY): Payer: Self-pay | Admitting: *Deleted

## 2011-12-07 DIAGNOSIS — I10 Essential (primary) hypertension: Secondary | ICD-10-CM | POA: Insufficient documentation

## 2011-12-07 DIAGNOSIS — E119 Type 2 diabetes mellitus without complications: Secondary | ICD-10-CM | POA: Insufficient documentation

## 2011-12-07 DIAGNOSIS — M5430 Sciatica, unspecified side: Secondary | ICD-10-CM | POA: Insufficient documentation

## 2011-12-07 HISTORY — DX: Dorsalgia, unspecified: M54.9

## 2011-12-07 HISTORY — DX: Essential (primary) hypertension: I10

## 2011-12-07 MED ORDER — HYDROMORPHONE HCL PF 2 MG/ML IJ SOLN
2.0000 mg | Freq: Once | INTRAMUSCULAR | Status: AC
  Administered 2011-12-07: 2 mg via INTRAMUSCULAR

## 2011-12-07 MED ORDER — HYDROMORPHONE HCL PF 2 MG/ML IJ SOLN
2.0000 mg | Freq: Once | INTRAMUSCULAR | Status: DC
  Filled 2011-12-07: qty 1

## 2011-12-07 MED ORDER — IBUPROFEN 600 MG PO TABS: 600.0000 mg | ORAL_TABLET | Freq: Three times a day (TID) | ORAL | Status: AC | PRN

## 2011-12-07 MED ORDER — IBUPROFEN 200 MG PO TABS
600.0000 mg | ORAL_TABLET | Freq: Once | ORAL | Status: AC
  Administered 2011-12-07: 600 mg via ORAL
  Filled 2011-12-07: qty 3

## 2011-12-07 NOTE — Discharge Instructions (Signed)

## 2011-12-07 NOTE — ED Notes (Signed)
Pt reports hx of chronic lower back pain, pt reports he was here for same on Monday with no relief. Pt reports pain radiates down left leg. Pt uses cane for ambulation.

## 2011-12-07 NOTE — ED Provider Notes (Addendum)
History  Scribed for Hoy Morn, MD, the patient was seen in room STRE3/STRE3. This chart was scribed by Truddie Coco. The patient's care started at 12:34 PM    CSN: [id number]  Arrival date & time 12/07/11  1140   First MD Initiated Contact with Patient 12/07/11 1148      Chief Complaint  Patient presents with  . Back Pain     The history is provided by the patient.   Anthony Owens is a 56 y.o. male who presents to the Emergency Department complaining of lower left back pain that radiates down his left leg to his thigh that started four days ago.  Pt is having difficulty walking and has been using a cane.   Pt was seen in the ED and given percocet which has not helped.   Past Medical History  Diagnosis Date  . Hypertension   . Diabetes mellitus   . Back pain     History reviewed. No pertinent past surgical history.  History reviewed. No pertinent family history.  History  Substance Use Topics  . Smoking status: Never Smoker   . Smokeless tobacco: Not on file  . Alcohol Use: No      Review of Systems  All other systems reviewed and are negative.    Allergies  Review of patient's allergies indicates no known allergies.  Home Medications   Current Outpatient Rx  Name Route Sig Dispense Refill  . ACETAMINOPHEN 500 MG PO TABS Oral Take 500 mg by mouth every 4 (four) hours as needed. For pain    . HYDROCHLOROTHIAZIDE 25 MG PO TABS Oral Take 25 mg by mouth daily.    . OXYCODONE-ACETAMINOPHEN 5-325 MG PO TABS Oral Take 2 tablets by mouth every 4 (four) hours as needed. For pain    . PIOGLITAZONE HCL 45 MG PO TABS Oral Take 45 mg by mouth daily.      BP 147/106  Pulse 104  Temp 98.1 F (36.7 C) (Oral)  Resp 18  SpO2 94%  Physical Exam  Nursing note and vitals reviewed. Constitutional: He is oriented to person, place, and time. He appears well-developed and well-nourished. No distress.  HENT:  Head: Normocephalic and atraumatic.  Eyes: EOM are  normal.  Neck: Neck supple. No tracheal deviation present.  Cardiovascular: Normal rate.   Pulmonary/Chest: Effort normal. No respiratory distress.  Musculoskeletal: Normal range of motion.       Low L-spine tenderness.  Tenderness of the sciatic groove.  No rash.    Neurological: He is alert and oriented to person, place, and time.  Skin: Skin is warm and dry.  Psychiatric: He has a normal mood and affect. His behavior is normal.    ED Course  Procedures  DIAGNOSTIC STUDIES: Oxygen Saturation is 94% on room air, normal by my interpretation.    COORDINATION OF CARE:  12:38PM Ordered: DG Lumbar Spine Complete   1:33 PM Recheck: Pt states his pain is somewhat better.  Discussed pained management with pt.    Labs Reviewed - No data to display Dg Lumbar Spine Complete  12/07/2011  *RADIOLOGY REPORT*  Clinical Data: Chronic low back pain.  Leg pain.  LUMBAR SPINE - COMPLETE 4+ VIEW  Comparison: 05/16/2009.  Findings: Vertebral body height and alignment are maintained.  Very minimal endplate degenerative change at L3-4.  No disc space height loss.  Probable mild facet sclerosis in the lower lumbar spine.  IMPRESSION: Minimal spondylosis, as above.  Original Report Authenticated By: Rip Harbour  A. Rosario Jacks, M.D.     1. Sciatica       MDM  The patient feels much better after narcotic medications.  His x-rays without significant abnormality.  I think this is left-sided sciatica.  He has no weakness in his left lower extremity.  Discharge home with pain control and PCP followup.  Nothing else to suggest severe pathology  Normal lower extremity neurologic exam. No bowel or bladder complaints.Doubt spinal epidural abscess. Doubt cauda equina. Doubt abdominal aortic aneurysm   I personally performed the services described in this documentation, which was scribed in my presence. The recorded information has been reviewed and considered.           Hoy Morn, MD 12/07/11  [place], MD 12/07/11 [phone]

## 2011-12-25 ENCOUNTER — Ambulatory Visit (HOSPITAL_COMMUNITY)
Admission: RE | Admit: 2011-12-25 | Discharge: 2011-12-25 | Disposition: A | Discharge status: Home / Self Care | Payer: 59 | Source: Ambulatory Visit | Attending: Internal Medicine | Admitting: Internal Medicine

## 2011-12-25 DIAGNOSIS — M79609 Pain in unspecified limb: Secondary | ICD-10-CM | POA: Insufficient documentation

## 2011-12-25 DIAGNOSIS — M545 Low back pain, unspecified: Secondary | ICD-10-CM | POA: Insufficient documentation

## 2011-12-25 DIAGNOSIS — M47817 Spondylosis without myelopathy or radiculopathy, lumbosacral region: Secondary | ICD-10-CM | POA: Insufficient documentation

## 2011-12-25 DIAGNOSIS — M7989 Other specified soft tissue disorders: Secondary | ICD-10-CM

## 2011-12-25 LAB — BUN: BUN: 19 mg/dL (ref 6–23)

## 2011-12-25 LAB — CREATININE, SERUM
Creatinine, Ser: 1.6 mg/dL — ABNORMAL HIGH (ref 0.50–1.35)
GFR calc Af Amer: 55 mL/min — ABNORMAL LOW (ref >90)

## 2012-02-28 ENCOUNTER — Encounter: Payer: Self-pay | Admitting: Internal Medicine

## 2012-02-29 ENCOUNTER — Emergency Department (HOSPITAL_COMMUNITY): Payer: 59

## 2012-02-29 ENCOUNTER — Emergency Department (HOSPITAL_COMMUNITY)
Admission: EM | Admit: 2012-02-29 | Discharge: 2012-02-29 | Disposition: A | Discharge status: Home / Self Care | Payer: 59 | Attending: Emergency Medicine | Admitting: Emergency Medicine

## 2012-02-29 ENCOUNTER — Encounter (HOSPITAL_COMMUNITY): Payer: Self-pay | Admitting: Emergency Medicine

## 2012-02-29 DIAGNOSIS — I10 Essential (primary) hypertension: Secondary | ICD-10-CM | POA: Insufficient documentation

## 2012-02-29 DIAGNOSIS — Z79899 Other long term (current) drug therapy: Secondary | ICD-10-CM | POA: Insufficient documentation

## 2012-02-29 DIAGNOSIS — E119 Type 2 diabetes mellitus without complications: Secondary | ICD-10-CM | POA: Insufficient documentation

## 2012-02-29 DIAGNOSIS — Z7982 Long term (current) use of aspirin: Secondary | ICD-10-CM | POA: Insufficient documentation

## 2012-02-29 DIAGNOSIS — K2970 Gastritis, unspecified, without bleeding: Secondary | ICD-10-CM

## 2012-02-29 DIAGNOSIS — M546 Pain in thoracic spine: Secondary | ICD-10-CM | POA: Insufficient documentation

## 2012-02-29 LAB — GLUCOSE, CAPILLARY: Glucose-Capillary: 157 mg/dL — ABNORMAL HIGH (ref 70–99)

## 2012-02-29 LAB — URINALYSIS, ROUTINE W REFLEX MICROSCOPIC
Glucose, UA: NEGATIVE mg/dL
Leukocytes, UA: NEGATIVE
Protein, ur: >300 mg/dL — AB
pH: 6 (ref 5.0–8.0)

## 2012-02-29 LAB — CBC WITH DIFFERENTIAL/PLATELET
Basophils Absolute: 0 10*3/uL (ref 0.0–0.1)
Basophils Relative: 0 % (ref 0–1)
Eosinophils Absolute: 0 10*3/uL (ref 0.0–0.7)
MCHC: 33.3 g/dL (ref 30.0–36.0)
Monocytes Absolute: 0.5 10*3/uL (ref 0.1–1.0)
Neutro Abs: 4.3 10*3/uL (ref 1.7–7.7)
Neutrophils Relative %: 72 % (ref 43–77)
RDW: 13.2 % (ref 11.5–15.5)

## 2012-02-29 LAB — COMPREHENSIVE METABOLIC PANEL
AST: 15 U/L (ref 0–37)
Albumin: 4 g/dL (ref 3.5–5.2)
Chloride: 96 mEq/L (ref 96–112)
Creatinine, Ser: 1.89 mg/dL — ABNORMAL HIGH (ref 0.50–1.35)
Potassium: 3.4 mEq/L — ABNORMAL LOW (ref 3.5–5.1)
Total Bilirubin: 0.3 mg/dL (ref 0.3–1.2)
Total Protein: 7.6 g/dL (ref 6.0–8.3)

## 2012-02-29 LAB — URINE MICROSCOPIC-ADD ON

## 2012-02-29 LAB — LIPASE, BLOOD: Lipase: 63 U/L — ABNORMAL HIGH (ref 11–59)

## 2012-02-29 MED ORDER — SODIUM CHLORIDE 0.9 % IV BOLUS (SEPSIS)
1000.0000 mL | Freq: Once | INTRAVENOUS | Status: AC
  Administered 2012-02-29: 1000 mL via INTRAVENOUS

## 2012-02-29 MED ORDER — PANTOPRAZOLE SODIUM 20 MG PO TBEC: 20.0000 mg | DELAYED_RELEASE_TABLET | Freq: Every day | ORAL | Status: DC

## 2012-02-29 MED ORDER — GI COCKTAIL ~~LOC~~
30.0000 mL | Freq: Once | ORAL | Status: AC
  Administered 2012-02-29: 30 mL via ORAL
  Filled 2012-02-29: qty 30

## 2012-02-29 MED ORDER — PANTOPRAZOLE SODIUM 40 MG IV SOLR
40.0000 mg | Freq: Once | INTRAVENOUS | Status: AC
  Administered 2012-02-29: 40 mg via INTRAVENOUS
  Filled 2012-02-29: qty 40

## 2012-02-29 MED ORDER — ONDANSETRON HCL 4 MG PO TABS: 4.0000 mg | ORAL_TABLET | Freq: Four times a day (QID) | ORAL | Status: AC

## 2012-02-29 MED ORDER — ONDANSETRON HCL 4 MG/2ML IJ SOLN
4.0000 mg | Freq: Once | INTRAMUSCULAR | Status: AC
  Administered 2012-02-29: 4 mg via INTRAVENOUS
  Filled 2012-02-29: qty 2

## 2012-02-29 MED ORDER — MORPHINE SULFATE 4 MG/ML IJ SOLN
4.0000 mg | Freq: Once | INTRAMUSCULAR | Status: AC
  Administered 2012-02-29: 4 mg via INTRAVENOUS
  Filled 2012-02-29: qty 1

## 2012-02-29 MED ORDER — TRAMADOL HCL 50 MG PO TABS: 50.0000 mg | ORAL_TABLET | Freq: Four times a day (QID) | ORAL | Status: AC | PRN

## 2012-02-29 NOTE — ED Notes (Signed)
Pt in route to [place] at this time.

## 2012-02-29 NOTE — ED Provider Notes (Signed)
History     CSN: [id number]  Arrival date & time 02/29/12  1743   First MD Initiated Contact with Patient 02/29/12 2124      Chief Complaint  Patient presents with  . Abdominal Pain    (Consider location/radiation/quality/duration/timing/severity/associated sxs/prior treatment) HPI Pt p/w episodic RUQ/epigastric pain that radiates to R thoracic back x 2 weeks. Associated with food. + nausea and vomiting. No fever or chills.  Past Medical History  Diagnosis Date  . Hypertension   . Diabetes mellitus   . Back pain     History reviewed. No pertinent past surgical history.  History reviewed. No pertinent family history.  History  Substance Use Topics  . Smoking status: Never Smoker   . Smokeless tobacco: Not on file  . Alcohol Use: No      Review of Systems  Constitutional: Negative for fever and chills.  Respiratory: Negative for shortness of breath.   Cardiovascular: Negative for chest pain.  Gastrointestinal: Positive for nausea, vomiting and abdominal pain. Negative for diarrhea.  Genitourinary: Negative for dysuria, frequency and flank pain.  Musculoskeletal: Positive for back pain.  Skin: Negative for rash and wound.  Neurological: Negative for dizziness, weakness, light-headedness, numbness and headaches.    Allergies  Review of patient's allergies indicates no known allergies.  Home Medications   Current Outpatient Rx  Name Route Sig Dispense Refill  . ACETAMINOPHEN 500 MG PO TABS Oral Take 500 mg by mouth every 4 (four) hours as needed. For pain    . ASPIRIN 325 MG PO TABS Oral Take 325 mg by mouth daily.    Marland Kitchen HYDROCHLOROTHIAZIDE 25 MG PO TABS Oral Take 25 mg by mouth daily.    Marland Kitchen PIOGLITAZONE HCL 45 MG PO TABS Oral Take 45 mg by mouth daily.    Marland Kitchen ONDANSETRON HCL 4 MG PO TABS Oral Take 1 tablet (4 mg total) by mouth every 6 (six) hours. 12 tablet 0  . PANTOPRAZOLE SODIUM 20 MG PO TBEC Oral Take 1 tablet (20 mg total) by mouth daily. 30 tablet 0  .  TRAMADOL HCL 50 MG PO TABS Oral Take 1 tablet (50 mg total) by mouth every 6 (six) hours as needed for pain. 15 tablet 0    BP 146/92  Pulse 84  Temp 98.1 F (36.7 C) (Oral)  Resp 18  SpO2 100%  Physical Exam  Nursing note and vitals reviewed. Constitutional: He is oriented to person, place, and time. He appears well-developed and well-nourished. No distress.  HENT:  Head: Normocephalic and atraumatic.  Mouth/Throat: Oropharynx is clear and moist.  Eyes: EOM are normal. Pupils are equal, round, and reactive to light.  Neck: Normal range of motion. Neck supple.  Cardiovascular: Normal rate and regular rhythm.   Pulmonary/Chest: Effort normal and breath sounds normal. No respiratory distress. He has no wheezes. He has no rales.  Abdominal: Soft. Bowel sounds are normal. There is tenderness (TTP epigastrum and RUQ. No rebound or guarding).  Musculoskeletal: Normal range of motion. He exhibits no edema and no tenderness.  Neurological: He is alert and oriented to person, place, and time.  Skin: Skin is warm and dry. No rash noted. No erythema.  Psychiatric: He has a normal mood and affect. His behavior is normal.    ED Course  Procedures (including critical care time)  Labs Reviewed  COMPREHENSIVE METABOLIC PANEL - Abnormal; Notable for the following:    Potassium 3.4 (*)     Glucose, Bld 152 (*)  Creatinine, Ser 1.89 (*)     GFR calc non Af Amer 39 (*)     GFR calc Af Amer 45 (*)     All other components within normal limits  URINALYSIS, ROUTINE W REFLEX MICROSCOPIC - Abnormal; Notable for the following:    Hgb urine dipstick TRACE (*)     Ketones, ur 15 (*)     Protein, ur >300 (*)     All other components within normal limits  GLUCOSE, CAPILLARY - Abnormal; Notable for the following:    Glucose-Capillary 157 (*)     All other components within normal limits  LIPASE, BLOOD - Abnormal; Notable for the following:    Lipase 63 (*)     All other components within normal  limits  CBC WITH DIFFERENTIAL  URINE MICROSCOPIC-ADD ON   US Abdomen Complete  02/29/2012  *RADIOLOGY REPORT*  Clinical Data:  Abdominal pain for 1 week, cramping, bilious vomiting  ULTRASOUND ABDOMEN:  Technique:  Sonography of upper abdominal structures was performed. Exam quality degraded by body habitus and interference by bowel gas.  Comparison:  12/06/2010  Gallbladder:  Normally distended without stones or wall thickening. No pericholecystic fluid or sonographic Murphy sign.  Common bile duct:  4 mm diameter  Liver:  Slightly increased echogenicity question fatty infiltration though this can be related to cirrhosis or potentially artifact from body habitus.  No gross pelvic mass or nodularity.  IVC:  Normal appearance  Pancreas:  Predominately obscured by bowel gas.  Spleen:  Normal appearance, 6.4 cm length  Right kidney:  10.5 cm length.  Suboptimally visualized due to body habitus and bowel gas.  No gross mass hydronephrosis.  Left kidney:  11.0 cm length.  Suboptimally visualized due to body habitus and bowel gas.  No gross mass or hydronephrosis.  Aorta:  Normal caliber  Other:  Or no free fluid  IMPRESSION: Question minimal fatty infiltration of liver. Inadequate pancreatic visualization. No gross acute sonographic abnormalities identified.   Original Report Authenticated By: Burnetta Sabin, M.D.      1. Gastritis       MDM   Pt states he is feeling much better. Has appointment to see GI MD later this month. Prev hx of PUD. Will treat as such. Return for worsening condition or any concerns       Julianne Rice, MD 03/01/12 0028

## 2012-02-29 NOTE — ED Notes (Addendum)
Pt reports generalized abd pain X 1 week described as cramping, stabbing; reports n/v; denies dysuria; reports foods are losing taste

## 2012-03-21 ENCOUNTER — Encounter: Payer: Self-pay | Admitting: Internal Medicine

## 2012-03-25 ENCOUNTER — Encounter: Payer: Self-pay | Admitting: Internal Medicine

## 2012-03-25 ENCOUNTER — Ambulatory Visit (INDEPENDENT_AMBULATORY_CARE_PROVIDER_SITE_OTHER): Payer: 59 | Admitting: Internal Medicine

## 2012-03-25 VITALS — BP 126/78 | HR 80 | Ht 71.0 in | Wt 245.2 lb

## 2012-03-25 DIAGNOSIS — R1013 Epigastric pain: Secondary | ICD-10-CM

## 2012-03-25 DIAGNOSIS — Z8719 Personal history of other diseases of the digestive system: Secondary | ICD-10-CM

## 2012-03-25 DIAGNOSIS — R109 Unspecified abdominal pain: Secondary | ICD-10-CM

## 2012-03-25 MED ORDER — PANTOPRAZOLE SODIUM 20 MG PO TBEC: 20.0000 mg | DELAYED_RELEASE_TABLET | Freq: Every day | ORAL | Status: DC

## 2012-03-25 NOTE — Patient Instructions (Addendum)
You have been scheduled for an endoscopy with propofol. Please follow written instructions given to you at your visit today. If you use inhalers (even only as needed), please bring them with you on the day of your procedure.  Continue taking protonix.   NSAIDs and Peptic Ulcers A peptic ulcer is a defect that forms in the lining of the stomach or first part of the small intestine.  CAUSES  Most peptic ulcers are caused by infection with the bacterium H. pylori (Helicobacter pylori). Some peptic ulcers are caused by prolonged use of NSAIDs (nonsteroidal anti-inflammatory drugs). NSAIDs include aspirin, ibuprofen, and naproxen sodium. SYMPTOMS  An ulcer can cause:  A gnawing, burning pain in the upper abdomen.   Nausea.   Vomiting.   Loss of appetite.   Weight loss.   Fatigue.  Normally the stomach has three defenses against digestive juices:   Mucus that coats the stomach lining and shields it from stomach acid.   The chemical bicarbonate that neutralizes stomach acid.   Blood circulation to the stomach lining that aids in cell renewal and repair.  NSAIDs hinder all of these protective mechanisms. With the stomach's defenses down, digestive juices can damage the sensitive stomach lining and cause ulcers. TREATMENT   NSAID-induced ulcers usually heal once the person stops taking the medication. Your caregiver may recommend taking antacids to neutralize the acid. Antacids help the healing process and relieve symptoms. Drugs called H2-blockers or proton-pump inhibitors decrease the amount of acid the stomach produces. Medicines that protect the stomach lining also help with healing.   If a person with an NSAID ulcer also tests positive for H. pylori, he or she will be treated with antibiotics.   Surgery may be necessary if an ulcer recurs or fails to heal.   Surgery may also be necessary if complications like:   Severe bleeding.   Perforation.   Obstruction develop.  Anyone  taking NSAIDs who experiences symptoms of peptic ulcer should see their caregiver for prompt treatment. Delaying diagnosis and treatment can lead to complications and the need for surgery. FOR MORE INFORMATION National Digestive Diseases Information Clearinghouse: http://digestive.http://www.heath.com/.htm Document Released: 05/18/2004 Document Revised: 06/01/2011 Document Reviewed: 05/28/2007 [facility] Patient Information 2012 [place].

## 2012-03-25 NOTE — Progress Notes (Signed)
Patient ID: Anthony Owens, male   DOB: 10/20/1955, 56 y.o.   MRN: [id number]  SUBJECTIVE: HPI Anthony Owens is a 56 year old male with a PMH of hypertension, diabetes, chronic low back pain, and duodenal ulcer disease who is seen in consultation at the request of Dr. Tomma Lightning for evaluation of epigastric abdominal pain. The patient reports intermittent and episodic epigastric abdominal pain. This has occurred off and on for one to 2 years, but was severe 3 or 4 weeks ago necessitating an ER evaluation.  This pain is located in the epigastrium and associated with nausea and occasional vomiting. When it is hurting he avoids eating, but is unsure if eating makes it worse. He has noted on-and-off issues with heartburn but no dysphagia or odynophagia. He does report an approximate 15 pound weight loss over the last 1-2 months. He's had no change in bowel habits including no melena or bright red blood per rectum. He denies using NSAIDs.  He does have a history for hospitalization with anemia and concern of GI blood loss. EGD in June of 2012 revealed a duodenal ulcer and gastritis. Biopsies at that time were negative for H. Pylori. A colonoscopy was performed the incomplete. Colonoscopy was performed to the transverse colon which did not reveal any pathology, and subsequent barium enema showed no concerning lesions throughout the colon.  He reports improvement in his anemia, and blood counts checked recently were indeed normal.  Review of Systems  As per history of present illness, otherwise negative   Past Medical History  Diagnosis Date  . Hypertension   . Diabetes mellitus   . Back pain     Current Outpatient Prescriptions  Medication Sig Dispense Refill  . acetaminophen (TYLENOL) 500 MG tablet Take 500 mg by mouth every 4 (four) hours as needed. For pain      . aspirin 325 MG tablet Take 325 mg by mouth daily.      . hydrochlorothiazide (HYDRODIURIL) 25 MG tablet Take 25 mg by mouth daily.      .  pantoprazole (PROTONIX) 20 MG tablet Take 1 tablet (20 mg total) by mouth daily.  30 tablet  0  . pioglitazone (ACTOS) 45 MG tablet Take 45 mg by mouth daily.      Marland Kitchen DISCONTD: pantoprazole (PROTONIX) 20 MG tablet Take 1 tablet (20 mg total) by mouth daily.  30 tablet  0    No Known Allergies  Family History  Problem Relation Age of Onset  . Heart disease Mother   . Heart disease Father   . Diabetes Sister     History  Substance Use Topics  . Smoking status: Never Smoker   . Smokeless tobacco: Never Used  . Alcohol Use: No    OBJECTIVE: BP 126/78  Pulse 80  Ht 5\' 11"  (1.803 m)  Wt 245 lb 3.2 oz (111.222 kg)  BMI 34.20 kg/m2 Constitutional: Well-developed and well-nourished. No distress. HEENT: Normocephalic and atraumatic. Oropharynx is clear and moist. No oropharyngeal exudate. Conjunctivae are normal. Pupils are equal round and reactive to light. No scleral icterus. Neck: Neck supple. Trachea midline. Cardiovascular: Normal rate, regular rhythm and intact distal pulses. No M/R/G Pulmonary/chest: Effort normal and breath sounds normal. No wheezing, rales or rhonchi. Abdominal: Soft, mild epigastric pain without rebound or guarding, obese, nondistended. Bowel sounds active throughout. There are no masses palpable. No hepatosplenomegaly. Extremities: no clubbing, cyanosis, or edema Lymphadenopathy: No cervical adenopathy noted. Neurological: Alert and oriented to person place and time. Skin: Skin is warm  and dry. No rashes noted. Psychiatric: Normal mood and affect. Behavior is normal.  Labs and Imaging -- CBC    Component Value Date/Time   WBC 6.0 02/29/2012 1819   RBC 4.99 02/29/2012 1819   HGB 14.6 02/29/2012 1819   HCT 43.8 02/29/2012 1819   PLT 203 02/29/2012 1819   MCV 87.8 02/29/2012 1819   MCH 29.3 02/29/2012 1819   MCHC 33.3 02/29/2012 1819   RDW 13.2 02/29/2012 1819   LYMPHSABS 1.1 02/29/2012 1819   MONOABS 0.5 02/29/2012 1819   EOSABS 0.0 02/29/2012 1819   BASOSABS 0.0  02/29/2012 1819    CMP     Component Value Date/Time   NA 138 02/29/2012 1819   K 3.4* 02/29/2012 1819   CL 96 02/29/2012 1819   CO2 31 02/29/2012 1819   GLUCOSE 152* 02/29/2012 1819   BUN 20 02/29/2012 1819   CREATININE 1.89* 02/29/2012 1819   CALCIUM 10.3 02/29/2012 1819   PROT 7.6 02/29/2012 1819   ALBUMIN 4.0 02/29/2012 1819   AST 15 02/29/2012 1819   ALT 14 02/29/2012 1819   ALKPHOS 69 02/29/2012 1819   BILITOT 0.3 02/29/2012 1819   GFRNONAA 39* 02/29/2012 1819   GFRAA 45* 02/29/2012 1819   Lipase     Component Value Date/Time   LIPASE 63* 02/29/2012 1819    SINGLE CONTRAST BARIUM ENEMA -- June 2012   Fluoroscopy Time: 1 minute 31 seconds   Contrast: Barium   Comparison: CT abdomen and pelvis 12/06/2010.   Findings: Scout film demonstrates a fairly large volume gas throughout the colon from colonoscopy.  Due to a fairly large volume gas present within the colon from the attempted endoscopy this same day, a single contrast was performed.  The patient has a redundant colon.  Contrast material flows into the cecum and refluxes into the appendix and small bowel.  No stricture, polyp or mass is identified.  No diverticular disease is identified.   IMPRESSION: Negative examination.  No finding to explain anemia.   ULTRASOUND ABDOMEN:  -- Sept 2013   Technique:  Sonography of upper abdominal structures was performed. Exam quality degraded by body habitus and interference by bowel gas.   Comparison:  12/06/2010   Gallbladder:  Normally distended without stones or wall thickening. No pericholecystic fluid or sonographic Murphy sign.   Common bile duct:  4 mm diameter   Liver:  Slightly increased echogenicity question fatty infiltration though this can be related to cirrhosis or potentially artifact from body habitus.  No gross pelvic mass or nodularity.   IVC:  Normal appearance   Pancreas:  Predominately obscured by bowel gas.   Spleen:  Normal appearance, 6.4 cm length   Right  kidney:  10.5 cm length.  Suboptimally visualized due to body habitus and bowel gas.  No gross mass hydronephrosis.   Left kidney:  11.0 cm length.  Suboptimally visualized due to body habitus and bowel gas.  No gross mass or hydronephrosis.   Aorta:  Normal caliber   Other:  Or no free fluid   IMPRESSION: Question minimal fatty infiltration of liver. Inadequate pancreatic visualization. No gross acute sonographic abnormalities identified.  ASSESSMENT AND PLAN: 56 year old male with a PMH of hypertension, diabetes, chronic low back pain, and duodenal ulcer disease who is seen in consultation at the request of Dr. Tomma Lightning for evaluation of epigastric abdominal pain.\  1. Epigastric abdominal pain/history of duodenal ulcer disease -- the patient restarted the PPI after his ER visit with pantoprazole 20 mg  daily. Though given his history of prior ulcer disease and episodic pain, I recommend repeat EGD to ensure he does not have ongoing ulcer disease, etc. If he does I would plan repeat biopsies both of the small bowel and stomach, the latter to exclude H. Pylori, especially given that he does not use NSAIDs.  For now I recommended that he continue to avoid NSAIDs and continue pantoprazole 20 mg daily. If inflammation is seen at the time of the EGD, we will likely increase his PPI dose. Should he have return of severe pain prior to procedure, he is asked to notify us.  2.  CRC screening -- he had an incomplete colonoscopy in June 2013, but a subsequent barium enema which was unremarkable. Therefore, he is up-to-date on colorectal cancer screening, and should have repeat attempted colonoscopy in 5 years or repeat barium enema at the same interval.

## 2012-03-26 ENCOUNTER — Encounter: Payer: Self-pay | Admitting: Internal Medicine

## 2012-03-26 DIAGNOSIS — K449 Diaphragmatic hernia without obstruction or gangrene: Secondary | ICD-10-CM

## 2012-03-26 DIAGNOSIS — A048 Other specified bacterial intestinal infections: Secondary | ICD-10-CM

## 2012-03-26 HISTORY — DX: Diaphragmatic hernia without obstruction or gangrene: K44.9

## 2012-03-26 HISTORY — DX: Other specified bacterial intestinal infections: A04.8

## 2012-04-01 ENCOUNTER — Ambulatory Visit (AMBULATORY_SURGERY_CENTER): Payer: 59 | Admitting: Internal Medicine

## 2012-04-01 ENCOUNTER — Encounter: Payer: Self-pay | Admitting: Internal Medicine

## 2012-04-01 VITALS — BP 139/63 | HR 78 | Temp 97.7°F | Resp 18 | Ht 71.0 in | Wt 245.0 lb

## 2012-04-01 DIAGNOSIS — R109 Unspecified abdominal pain: Secondary | ICD-10-CM

## 2012-04-01 DIAGNOSIS — R1013 Epigastric pain: Secondary | ICD-10-CM

## 2012-04-01 DIAGNOSIS — J45909 Unspecified asthma, uncomplicated: Secondary | ICD-10-CM

## 2012-04-01 DIAGNOSIS — A048 Other specified bacterial intestinal infections: Secondary | ICD-10-CM

## 2012-04-01 DIAGNOSIS — K2980 Duodenitis without bleeding: Secondary | ICD-10-CM

## 2012-04-01 HISTORY — PX: ESOPHAGOGASTRODUODENOSCOPY: SHX1529

## 2012-04-01 LAB — GLUCOSE, CAPILLARY: Glucose-Capillary: 123 mg/dL — ABNORMAL HIGH (ref 70–99)

## 2012-04-01 MED ORDER — PANTOPRAZOLE SODIUM 40 MG PO TBEC: DELAYED_RELEASE_TABLET | ORAL | Status: DC

## 2012-04-01 MED ORDER — SODIUM CHLORIDE 0.9 % IV SOLN: 500.0000 mL | INTRAVENOUS | Status: DC

## 2012-04-01 MED ORDER — PANTOPRAZOLE SODIUM 20 MG PO TBEC: 40.0000 mg | DELAYED_RELEASE_TABLET | Freq: Two times a day (BID) | ORAL | Status: DC

## 2012-04-01 NOTE — Op Note (Signed)
[place]  [address]. [place], [id number]   ENDOSCOPY PROCEDURE REPORT  PATIENT: Anthony Owens, Anthony Owens  MR#: [id number] BIRTHDATE: 31-Dec-1955 , 73  yrs. old GENDER: Male ENDOSCOPIST: Jerene Bears, MD REFERRED BY:  Aldona Bar PROCEDURE DATE:  04/01/2012 PROCEDURE:  EGD w/ biopsy and EGD w/ biopsy for H.pylori ASA CLASS:     Class III INDICATIONS:  epigastric pain.   history of duodenal ulcer. MEDICATIONS: MAC sedation, administered by CRNA and Propofol (Diprivan) 300 mg IV TOPICAL ANESTHETIC: Cetacaine Spray  DESCRIPTION OF PROCEDURE: After the risks benefits and alternatives of the procedure were thoroughly explained, informed consent was obtained.  The LB GIF-H180 [id number] endoscope was introduced through the mouth and advanced to the second portion of the duodenum. Without limitations.  The instrument was slowly withdrawn as the mucosa was fully examined.   ESOPHAGUS: A Schatzki ring was found 39 cm from the incisors and was widely open.   The esophagus was otherwise normal.  STOMACH: A hiatus hernia was found.   Moderate acute gastritis (inflammation) was found in the gastric antrum.  There were erosions present.  Biopsies were taken in the antrum and angularis. In gastric antrum there are 2 separate openings into the duodenal bulb, indicating likely prior perforation into the duodenal bulb with resulting fistulous connection.  DUODENUM: Two small non-bleeding shallow and clean-based ulcers were found in the duodenal bulb.  Biopsies were taken.   The duodenal mucosa showed no abnormalities in the 2nd part of the duodenum. Retroflexed views revealed a hiatal hernia.     The scope was then withdrawn from the patient and the procedure completed.  COMPLICATIONS: There were no complications.  ENDOSCOPIC IMPRESSION: 1.   Schatzki ring was found 39 cm from the incisors, non-obstructing 2.   The esophagus was otherwise normal. 3.   Hiatus hernia was  found 4.   Acute gastritis (inflammation) was found in the gastric antrum; biopsies were taken in the antrum and angularis 5.   In gastric antrum there are 2 separate openings into the duodenal bulb, indicating likely prior perforation into the duodenal bulb with resulting fistulous connection. 6.   Two small non-bleeding ulcers were found in the duodenal bulb 7.   The duodenal mucosa showed no abnormalities in the 2nd part of the duodenum    RECOMMENDATIONS: 1.  Await pathology results 2.  Avoid NSAIDS 3.  Follow-up of helicobacter pylori status, treat if indicated 4.  Increase pantoprazole to 40 mg twice daily given ongoing inflammation and ulcers seen today.  eSigned:  Jerene Bears, MD 04/01/2012 4:30 PM   CC:The Patient and Aldona Bar, MD  PATIENT NAME:  [name], [name] MR#: [id number]

## 2012-04-01 NOTE — Progress Notes (Signed)
Patient did not experience any of the following events: a burn prior to discharge; a fall within the facility; wrong site/side/patient/procedure/implant event; or a hospital transfer or hospital admission upon discharge from the facility. (G8907) Patient did not have preoperative order for IV antibiotic SSI prophylaxis. (G8918)  

## 2012-04-01 NOTE — Patient Instructions (Addendum)
YOU HAD AN ENDOSCOPIC PROCEDURE TODAY AT THE [facility]: Refer to the procedure report that was given to you for any specific questions about what was found during the examination.  If the procedure report does not answer your questions, please call your gastroenterologist to clarify.  If you requested that your care partner not be given the details of your procedure findings, then the procedure report has been included in a sealed envelope for you to review at your convenience later.  YOU SHOULD EXPECT: Some feelings of bloating in the abdomen. Passage of more gas than usual.  Walking can help get rid of the air that was put into your GI tract during the procedure and reduce the bloating. If you had a lower endoscopy (such as a colonoscopy or flexible sigmoidoscopy) you may notice spotting of blood in your stool or on the toilet paper. If you underwent a bowel prep for your procedure, then you may not have a normal bowel movement for a few days.  DIET: Your first meal following the procedure should be a light meal and then it is ok to progress to your normal diet.  A half-sandwich or bowl of soup is an example of a good first meal.  Heavy or fried foods are harder to digest and may make you feel nauseous or bloated.  Likewise meals heavy in dairy and vegetables can cause extra gas to form and this can also increase the bloating.  Drink plenty of fluids but you should avoid alcoholic beverages for 24 hours.  ACTIVITY: Your care partner should take you home directly after the procedure.  You should plan to take it easy, moving slowly for the rest of the day.  You can resume normal activity the day after the procedure however you should NOT DRIVE or use heavy machinery for 24 hours (because of the sedation medicines used during the test).    SYMPTOMS TO REPORT IMMEDIATELY: A gastroenterologist can be reached at any hour.  During normal business hours, 8:30 AM to 5:00 PM Monday through Friday,  call [phone].  After hours and on weekends, please call the GI answering service at [phone] who will take a message and have the physician on call contact you.   Following lower endoscopy (colonoscopy or flexible sigmoidoscopy):  Excessive amounts of blood in the stool  Significant tenderness or worsening of abdominal pains  Swelling of the abdomen that is new, acute  Fever of 100F or higher  Following upper endoscopy (EGD)  Vomiting of blood or coffee ground material  New chest pain or pain under the shoulder blades  Painful or persistently difficult swallowing  New shortness of breath  Fever of 100F or higher  Black, tarry-looking stools  FOLLOW UP: If any biopsies were taken you will be contacted by phone or by letter within the next 1-3 weeks.  Call your gastroenterologist if you have not heard about the biopsies in 3 weeks.  Our staff will call the home number listed on your records the next business day following your procedure to check on you and address any questions or concerns that you may have at that time regarding the information given to you following your procedure. This is a courtesy call and so if there is no answer at the home number and we have not heard from you through the emergency physician on call, we will assume that you have returned to your regular daily activities without incident.  SIGNATURES/CONFIDENTIALITY: You and/or your care   partner have signed paperwork which will be entered into your electronic medical record.  These signatures attest to the fact that that the information above on your After Visit Summary has been reviewed and is understood.  Full responsibility of the confidentiality of this discharge information lies with you and/or your care-partner.   Ulcer and gastrititis, and hiatal hernia information given.  Anthony Owens will follow up with you about helicobacter pylori status, will treat if needed.  Increase pantoprazole to 40 mg.  Twice daily.

## 2012-04-02 ENCOUNTER — Telehealth: Payer: Self-pay | Admitting: *Deleted

## 2012-04-02 LAB — GLUCOSE, CAPILLARY: Glucose-Capillary: 82 mg/dL (ref 70–99)

## 2012-04-02 NOTE — Telephone Encounter (Signed)
  Follow up Call-  Call back number 04/01/2012  Post procedure Call Back phone  # [phone]  Permission to leave phone message Yes     Patient questions:  Do you have a fever, pain , or abdominal swelling? no Pain Score  0 *  Have you tolerated food without any problems? yes  Have you been able to return to your normal activities? yes  Do you have any questions about your discharge instructions: Diet   no Medications  no Follow up visit  no  Do you have questions or concerns about your Care? no  Actions: * If pain score is 4 or above: No action needed, pain <4.

## 2012-04-05 ENCOUNTER — Encounter: Payer: Self-pay | Admitting: Internal Medicine

## 2012-04-05 ENCOUNTER — Other Ambulatory Visit: Payer: Self-pay

## 2012-04-05 MED ORDER — BIS SUBCIT-METRONID-TETRACYC 140-125-125 MG PO CAPS: 3.0000 | ORAL_CAPSULE | Freq: Four times a day (QID) | ORAL | Status: DC

## 2012-04-11 ENCOUNTER — Encounter: Payer: Self-pay | Admitting: *Deleted

## 2012-04-12 ENCOUNTER — Encounter: Payer: Self-pay | Admitting: Internal Medicine

## 2012-04-17 ENCOUNTER — Encounter: Payer: Self-pay | Admitting: Family Medicine

## 2012-04-17 ENCOUNTER — Ambulatory Visit (INDEPENDENT_AMBULATORY_CARE_PROVIDER_SITE_OTHER): Payer: 59 | Admitting: Family Medicine

## 2012-04-17 VITALS — BP 140/88 | HR 80 | Ht 69.5 in | Wt 249.0 lb

## 2012-04-17 DIAGNOSIS — J45909 Unspecified asthma, uncomplicated: Secondary | ICD-10-CM

## 2012-04-17 DIAGNOSIS — E7800 Pure hypercholesterolemia, unspecified: Secondary | ICD-10-CM

## 2012-04-17 DIAGNOSIS — Z125 Encounter for screening for malignant neoplasm of prostate: Secondary | ICD-10-CM

## 2012-04-17 DIAGNOSIS — B9681 Helicobacter pylori [H. pylori] as the cause of diseases classified elsewhere: Secondary | ICD-10-CM | POA: Insufficient documentation

## 2012-04-17 DIAGNOSIS — E118 Type 2 diabetes mellitus with unspecified complications: Secondary | ICD-10-CM

## 2012-04-17 DIAGNOSIS — H1130 Conjunctival hemorrhage, unspecified eye: Secondary | ICD-10-CM

## 2012-04-17 DIAGNOSIS — K269 Duodenal ulcer, unspecified as acute or chronic, without hemorrhage or perforation: Secondary | ICD-10-CM | POA: Insufficient documentation

## 2012-04-17 DIAGNOSIS — Z23 Encounter for immunization: Secondary | ICD-10-CM

## 2012-04-17 DIAGNOSIS — I10 Essential (primary) hypertension: Secondary | ICD-10-CM

## 2012-04-17 DIAGNOSIS — A048 Other specified bacterial intestinal infections: Secondary | ICD-10-CM

## 2012-04-17 MED ORDER — ASPIRIN 81 MG PO TABS: 81.0000 mg | ORAL_TABLET | Freq: Every day | ORAL | Status: DC

## 2012-04-17 NOTE — Patient Instructions (Addendum)
Continue your current medications. Continue to check your blood sugars and blood pressures at home.  Please bring the list of your sugars and blood pressures with you to your next visit.  Feel free to bring your blood pressure machine with you to your next doctor's visit to have accuracy checked.  Change your aspirin dose from 325 mg daily to 81 mg daily  Your redness in your eye should improve over the next week. Make sure to get your diabetic eye exams yearly. Follow up with your podiatrist for treatment of your callouses.  Return for fasting bloodwork in the next couple of weeks. Return for a complete physical in the next few months.

## 2012-04-17 NOTE — Progress Notes (Signed)
Chief Complaint  Patient presents with  . Establish Care    new patient to establish care.   HPI: Patient presents to establish care. He denies any complaints, other than being told that he needed to find a new doctor (after [facility] closed).  He doesn't need any refills today, and doesn't know when labs are due.  He isn't fasting today.  Chart/old records reviewed--last seen at [facility] in 11/2011.  Lipids:  Cholesterol 204, LDL 139, TG 93, HDL 46. Cr 1.52, otherwise c-met okay. A1c was done, but result was on flowsheet, which wasn't included with the records. He was just recently diagnosed with H. Pylori, gastritis, duodenal ulcer, and is on treatment per Dr. Hilarie Fredrickson.  His abdominal pain has resolved.  DM: Sugars are running 125-140.  Denies polydipsia, polyuria.  Last eye exam <1 year ago.   Occasionally has some pain in his feet, but denies numbness, tingling, or any regular pain.  Some pain related to his callouses--he works standing on his feet all day.  Used to see podiatrist, but hasn't in a while.  He was on metformin and glipizide prior to hospitalization 11/2010.  Has been on Actos ever since discharge.  HTN:  BP's at home 120-130's/70's-80's.  Always <140/90.  Denies any headaches, dizziness, denies edema.  Previously was on lisinopril, stopped 11/2010 when hospitalized and had elevated creatinine (and iron deficiency, related to duodenal ulcer)  Dyslipidemia:  Previously took Lipitor.  He reports being taken off of it after having a biopsy on his leg (muscle), and has been off lipitor since.  Has been following a low cholesterol diet.  Review of chart shows he has also been on Zetia, not taking any lipid-lowering meds recently.  Trying to eat a low cholesterol diet.  Asthma:  Denies shortness of breath.  Has an inhaler in his car, but he only rarely needs it. (2-3 times/year).  Past Medical History  Diagnosis Date  . Hypertension   . Diabetes mellitus   . Back pain   .  Dyslipidemia   . Asthma   . PUD (peptic ulcer disease)     11/2010; duodenal ulcer  . Anemia 11/2010    duodenal ulcer, s/p transfusion  . Orbital fracture 11/2010    left (due to fall)  . H. pylori infection 03/2012    seen on EGD with gastritis, duodenitis and duodenal ulcer  . Hiatal hernia 03/2012   Past Surgical History  Procedure Date  . Esophagogastroduodenoscopy 04/01/12   History   Social History  . Marital Status: Single    Spouse Name: N/A    Number of Children: 0  . Years of Education: N/A   Occupational History  . [employer]    Social History Main Topics  . Smoking status: Never Smoker   . Smokeless tobacco: Never Used  . Alcohol Use: Yes     1-2 drinks per month.  . Drug Use: No     h/o marijuana use.  denies other drug use  . Sexually Active: Not on file   Other Topics Concern  . Not on file   Social History Narrative   Lives with his sister. Passive tobacco exposure from his sister.     Family History  Problem Relation Age of Onset  . Heart disease Mother   . Alzheimer's disease Mother   . Heart disease Father 58    MI  . Diabetes Sister   . Colon cancer Neg Hx   . Esophageal cancer Neg Hx   .  Rectal cancer Neg Hx   . Stomach cancer Neg Hx   . Stroke Neg Hx   . Cancer Maternal Grandmother     male cancer   Current outpatient prescriptions:aspirin 325 MG tablet, Take 325 mg by mouth daily., Disp: , Rfl: ;  bismuth-metronidazole-tetracycline (PYLERA) 140-125-125 MG per capsule, Take 3 capsules by mouth 4 (four) times daily., Disp: 120 capsule, Rfl: 0;  hydrochlorothiazide (HYDRODIURIL) 25 MG tablet, Take 25 mg by mouth daily., Disp: , Rfl: ;  pantoprazole (PROTONIX) 40 MG tablet, Take one by mouth twice daily before meals., Disp: 60 tablet, Rfl: 3 pioglitazone (ACTOS) 45 MG tablet, Take 45 mg by mouth daily., Disp: , Rfl: ;  acetaminophen (TYLENOL) 500 MG tablet, Take 500 mg by mouth every 4 (four) hours as needed. For pain, Disp: , Rfl:   No Known  Allergies  ROS: Denies fevers, URI symptoms, cough, shortness of breath, nausea and vomiting.  Epigastric abdominal pain has resolved since being treated for ulcer and H.pylori.  Denies bowel changes, urinary complaints, skin rashes, depression, insomnia. Some feet pain related to callouses.  Used to go to [employer], but hasn't been recently. Noticed redness to his right eye yesterday morning.  Denies pain, vision problems  PHYSICAL EXAM: BP 140/88  Pulse 80  Ht 5' 9.5" (1.765 m)  Wt 249 lb (112.946 kg)  BMI 36.24 kg/m2 Well developed, pleasant obese male in no distress.  Speech is somewhat difficult to understand--spoke very quickly and quietly HEENT:  PERRL.  R lateral eye--conjunctival hemorrhage. Anicteric sclera.  OP clear Neck: no lymphadenopathy, thyromegaly or mass. No carotid bruit Heart: regular rate and rhythm without murmur Lungs: clear bilaterally Back: no CVA tenderness Abdomen: soft, obese, nontender, no mass. No bruit Extremities: no edema, 2+ pulse.   Normal monofilament exam.  + foot odor. callouses on the bottom of both feet Psych: normal mood, affect, hygiene and grooming  ASSESSMENT/PLAN: 1. DM W/COMPLICATION NOS, TYPE II  Hemoglobin A1c, Comprehensive metabolic panel, TSH, Microalbumin / creatinine urine ratio  2. Need for prophylactic vaccination and inoculation against influenza  Flu vaccine greater than or equal to 3yo preservative free IM  3. 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 ASTHMA    5. ESSENTIAL HYPERTENSION  Comprehensive metabolic panel  6. Pure hypercholesterolemia  Lipid panel  7. Special screening for malignant neoplasm of prostate  PSA  8. Subconjunctival hemorrhage, non-traumatic     right eye, laterally    HTN--elevated today, improved per home numbers.  Continue to monitor, bring list to follow up. Yearly ophtho exams F/u with podiatrist  Return for fasting labs:   Consider restarting Zetia if  LDL still >100, versus Welchol.  Review records to learn more about statin complication  At some point, consider retrying ACEI, if Cr back to normal level  Return for CPE

## 2012-05-01 ENCOUNTER — Other Ambulatory Visit: Payer: 59

## 2012-05-01 DIAGNOSIS — E7800 Pure hypercholesterolemia, unspecified: Secondary | ICD-10-CM

## 2012-05-01 DIAGNOSIS — Z125 Encounter for screening for malignant neoplasm of prostate: Secondary | ICD-10-CM

## 2012-05-01 DIAGNOSIS — I10 Essential (primary) hypertension: Secondary | ICD-10-CM

## 2012-05-01 DIAGNOSIS — B9681 Helicobacter pylori [H. pylori] as the cause of diseases classified elsewhere: Secondary | ICD-10-CM

## 2012-05-01 DIAGNOSIS — E118 Type 2 diabetes mellitus with unspecified complications: Secondary | ICD-10-CM

## 2012-05-01 LAB — LIPID PANEL
Cholesterol: 201 mg/dL — ABNORMAL HIGH (ref 0–200)
LDL Cholesterol: 135 mg/dL — ABNORMAL HIGH (ref 0–99)
Total CHOL/HDL Ratio: 4.3 Ratio
VLDL: 19 mg/dL (ref 0–40)

## 2012-05-01 LAB — CBC WITH DIFFERENTIAL/PLATELET
Basophils Relative: 1 % (ref 0–1)
Eosinophils Absolute: 0.4 10*3/uL (ref 0.0–0.7)
Eosinophils Relative: 10 % — ABNORMAL HIGH (ref 0–5)
Hemoglobin: 14.1 g/dL (ref 13.0–17.0)
Lymphs Abs: 1.2 10*3/uL (ref 0.7–4.0)
MCH: 28.9 pg (ref 26.0–34.0)
MCHC: 33.3 g/dL (ref 30.0–36.0)
MCV: 86.7 fL (ref 78.0–100.0)
Monocytes Relative: 11 % (ref 3–12)
Neutrophils Relative %: 49 % (ref 43–77)
RBC: 4.88 MIL/uL (ref 4.22–5.81)

## 2012-05-01 LAB — COMPREHENSIVE METABOLIC PANEL
ALT: 17 U/L (ref 0–53)
AST: 20 U/L (ref 0–37)
Albumin: 3.6 g/dL (ref 3.5–5.2)
Alkaline Phosphatase: 63 U/L (ref 39–117)
Calcium: 8.9 mg/dL (ref 8.4–10.5)
Chloride: 106 mEq/L (ref 96–112)
Potassium: 4.4 mEq/L (ref 3.5–5.3)
Sodium: 142 mEq/L (ref 135–145)
Total Protein: 6 g/dL (ref 6.0–8.3)

## 2012-05-01 LAB — HEMOGLOBIN A1C: Hgb A1c MFr Bld: 6.8 % — ABNORMAL HIGH (ref <5.7)

## 2012-05-02 LAB — MICROALBUMIN / CREATININE URINE RATIO: Microalb, Ur: 151.32 mg/dL — ABNORMAL HIGH (ref 0.00–1.89)

## 2012-05-22 ENCOUNTER — Encounter: Payer: Self-pay | Admitting: Internal Medicine

## 2012-05-27 ENCOUNTER — Other Ambulatory Visit: Payer: Self-pay | Admitting: *Deleted

## 2012-05-27 DIAGNOSIS — I10 Essential (primary) hypertension: Secondary | ICD-10-CM

## 2012-05-27 DIAGNOSIS — E7800 Pure hypercholesterolemia, unspecified: Secondary | ICD-10-CM

## 2012-05-27 MED ORDER — EZETIMIBE 10 MG PO TABS: 10.0000 mg | ORAL_TABLET | Freq: Every day | ORAL | Status: DC

## 2012-05-27 MED ORDER — LISINOPRIL 20 MG PO TABS: 20.0000 mg | ORAL_TABLET | Freq: Every day | ORAL | Status: DC

## 2012-06-03 ENCOUNTER — Ambulatory Visit: Payer: 59 | Admitting: Internal Medicine

## 2012-06-10 ENCOUNTER — Encounter: Payer: Self-pay | Admitting: Internal Medicine

## 2012-06-10 ENCOUNTER — Other Ambulatory Visit: Payer: Self-pay | Admitting: Family Medicine

## 2012-06-11 ENCOUNTER — Encounter: Payer: Self-pay | Admitting: Internal Medicine

## 2012-06-12 ENCOUNTER — Ambulatory Visit (INDEPENDENT_AMBULATORY_CARE_PROVIDER_SITE_OTHER): Payer: 59 | Admitting: Internal Medicine

## 2012-06-12 ENCOUNTER — Encounter: Payer: Self-pay | Admitting: Internal Medicine

## 2012-06-12 VITALS — BP 118/64 | HR 80 | Ht 71.5 in | Wt 253.2 lb

## 2012-06-12 DIAGNOSIS — K2990 Gastroduodenitis, unspecified, without bleeding: Secondary | ICD-10-CM

## 2012-06-12 DIAGNOSIS — A048 Other specified bacterial intestinal infections: Secondary | ICD-10-CM

## 2012-06-12 DIAGNOSIS — K2970 Gastritis, unspecified, without bleeding: Secondary | ICD-10-CM

## 2012-06-12 DIAGNOSIS — K269 Duodenal ulcer, unspecified as acute or chronic, without hemorrhage or perforation: Secondary | ICD-10-CM

## 2012-06-12 DIAGNOSIS — Z8619 Personal history of other infectious and parasitic diseases: Secondary | ICD-10-CM

## 2012-06-12 NOTE — Patient Instructions (Addendum)
Your physician has requested that you go to the basement for the following lab work before leaving today: H-Pylori test  You will need to be off your PPI ( Pantoprazole) 2 weeks prior to taking the test.  Once you have the test you can resume Pantoprazole daily  Follow up with Dr. Hilarie Fredrickson as needed

## 2012-06-12 NOTE — Progress Notes (Signed)
  Subjective:    Patient ID: Anthony Owens, male    DOB: [birth year], 56 y.o.   MRN: [id number]  HPI [name] is a 56 year old male with a PMH of hypertension, diabetes, chronic low back pain, and duodenal ulcer disease who is seen in followup.   He was initially seen in late September 2013 with reports of intermittent epigastric abdominal pain. He came for upper endoscopy on 04/01/2012 which revealed a nonobstructing Schatzki's ring, hiatus hernia, acute gastritis, and 2 small nonbleeding duodenal bulb ulcers. Biopsies confirmed H. pylori and he completed triple therapy. He has remained on pantoprazole 40 mg twice a day since.  He reports complete improvement in his epigastric abdominal pain. He's having no nausea or vomiting. No dysphagia or odynophagia. He denies further weight loss. Appetite has been good.   Review of Systems As per history of present illness, otherwise negative  Current Medications, Allergies, Past Medical History, Past Surgical History, Family History and Social History were reviewed in [employer] [employer] record.     Objective:   Physical Exam BP 118/64  Pulse 80  Ht 5' 11.5" (1.816 m)  Wt 253 lb 3.2 oz (114.851 kg)  BMI 34.82 kg/m2 Constitutional: Well-developed and well-nourished. No distress. HEENT: Normocephalic and atraumatic.No scleral icterus. Cardiovascular: Normal rate, regular rhythm and intact distal pulses.  Pulmonary/chest: Effort normal and breath sounds normal. No wheezing, rales or rhonchi. Abdominal: Soft, nontender, nondistended. Bowel sounds active throughout. There are no masses palpable. No hepatosplenomegaly. Extremities: no clubbing, cyanosis, or edema Psychiatric: Normal mood and affect. Behavior is normal.    Assessment & Plan:  56 year old male with a PMH of hypertension, diabetes, chronic low back pain, and duodenal ulcer disease who is seen in followup  1.  H. Pylori gastritis/duodenal ulcer disease -- he completed H.  pylori therapy, has remained on twice a day PPI therapy, and is currently symptom-free. We discussed today that I would like to confirm H. pylori eradication by stool antigen. This needs to be performed after being off PPI for 2 weeks. This test is ordered today. I would like him to resume PPI on a once daily basis after the H. pylori test. He is aware that if H. pylori is found on the stool test he will need retreatment.  2.  CRC screening -- he had an incomplete colonoscopy in June 2013, but a subsequent barium enema which was unremarkable. Therefore, he is up-to-date on colorectal cancer screening, and should have repeat attempted colonoscopy in 5 years or repeat barium enema at the same interval.  He can be seen as needed

## 2012-07-22 ENCOUNTER — Encounter: Payer: 59 | Admitting: Family Medicine

## 2012-09-02 ENCOUNTER — Other Ambulatory Visit: Payer: Self-pay | Admitting: Family Medicine

## 2012-09-14 ENCOUNTER — Other Ambulatory Visit: Payer: Self-pay | Admitting: Family Medicine

## 2012-10-23 ENCOUNTER — Other Ambulatory Visit: Payer: Self-pay | Admitting: Family Medicine

## 2012-10-24 ENCOUNTER — Encounter: Payer: 59 | Admitting: Family Medicine

## 2012-12-25 ENCOUNTER — Other Ambulatory Visit: Payer: Self-pay | Admitting: Family Medicine

## 2012-12-25 NOTE — Telephone Encounter (Signed)
Would like to know if I should deny these meds, patient canceled his appt in Jan and no showed for CPE in May. Thanks.

## 2012-12-25 NOTE — Telephone Encounter (Signed)
Deny meds.  (he is likely out of most of his meds anyway, due to noncompliance, cancellation/no-shows).  Needs OV

## 2013-01-02 ENCOUNTER — Other Ambulatory Visit: Payer: Self-pay | Admitting: Family Medicine

## 2013-01-08 ENCOUNTER — Encounter: Payer: Self-pay | Admitting: Family Medicine

## 2013-01-08 ENCOUNTER — Ambulatory Visit (INDEPENDENT_AMBULATORY_CARE_PROVIDER_SITE_OTHER): Payer: PRIVATE HEALTH INSURANCE | Admitting: Family Medicine

## 2013-01-08 VITALS — BP 130/80 | HR 68 | Ht 70.0 in | Wt 262.0 lb

## 2013-01-08 DIAGNOSIS — I10 Essential (primary) hypertension: Secondary | ICD-10-CM

## 2013-01-08 DIAGNOSIS — E669 Obesity, unspecified: Secondary | ICD-10-CM | POA: Insufficient documentation

## 2013-01-08 DIAGNOSIS — Z125 Encounter for screening for malignant neoplasm of prostate: Secondary | ICD-10-CM

## 2013-01-08 DIAGNOSIS — Z79899 Other long term (current) drug therapy: Secondary | ICD-10-CM

## 2013-01-08 DIAGNOSIS — E785 Hyperlipidemia, unspecified: Secondary | ICD-10-CM

## 2013-01-08 DIAGNOSIS — E119 Type 2 diabetes mellitus without complications: Secondary | ICD-10-CM

## 2013-01-08 DIAGNOSIS — N529 Male erectile dysfunction, unspecified: Secondary | ICD-10-CM

## 2013-01-08 LAB — POCT GLYCOSYLATED HEMOGLOBIN (HGB A1C): Hemoglobin A1C: 6

## 2013-01-08 MED ORDER — TADALAFIL 2.5 MG PO TABS: 1.0000 | ORAL_TABLET | Freq: Every day | ORAL | Status: DC

## 2013-01-08 MED ORDER — LISINOPRIL-HYDROCHLOROTHIAZIDE 20-25 MG PO TABS: 1.0000 | ORAL_TABLET | Freq: Every day | ORAL | Status: DC

## 2013-01-08 MED ORDER — EZETIMIBE 10 MG PO TABS: ORAL_TABLET | ORAL | Status: DC

## 2013-01-08 MED ORDER — PIOGLITAZONE HCL 45 MG PO TABS: ORAL_TABLET | ORAL | Status: DC

## 2013-01-08 NOTE — Progress Notes (Signed)
Chief Complaint  Patient presents with  . Diabetes    fasting med check.   Patient presents for follow-up on diabetes, hypertension and dyslipidemia.   Diabetes follow-up:  Blood sugars at home are running 130-140 before meals.  Denies hypoglycemia.  Denies polydipsia and polyuria.  Last eye exam was 1-2 years ago.  Patient follows a low sugar diet and checks feet regularly without concerns.  Previously had neuropathy in feet, but resolved.  Denies burning, numbness or weakness in his feet.   Hypertension follow-up:  Blood pressures elsewhere are 130-140/80 (only rarely checks it at work).  Denies dizziness, headaches, chest pain.  Denies side effects of medications.  Hyperlipidemia follow-up:  Patient eats 2 eggs almost every day for breakfast.  Sausage 2-3 x/week along with it.   He did not return in January for repeat labs after being started back on Zetia for his cholesterol, hasn't had any labs since November 2013.  He was last given a refill of zetia 4/30, and then no-showed his labs in May. He has been off the Zetia since end of May.  ED:  Previously took Viagra, which was effective but he could only get 6/month.  He would like to try the daily cialis.    Asthma:  Rarely needs to use inhaler, maybe once a month.  He has gained 17 pounds since his first visit with me.  He works 5-6 days/week, and isn't getting any exercise.  Only eats one meal/day, usually breakfast, sometimes eats at work.  Past Medical History  Diagnosis Date  . Hypertension   . Diabetes mellitus   . Back pain     resolved  . Dyslipidemia   . Asthma   . PUD (peptic ulcer disease)     11/2010; duodenal ulcer  . Anemia 11/2010    duodenal ulcer, s/p transfusion  . Orbital fracture 11/2010    left (due to fall)  . H. pylori infection 03/2012    seen on EGD with gastritis, duodenitis and duodenal ulcer  . Hiatal hernia 03/2012  . Erectile dysfunction    Past Surgical History  Procedure Laterality Date  .  Esophagogastroduodenoscopy  04/01/12   History   Social History  . Marital Status: Single    Spouse Name: N/A    Number of Children: 0  . Years of Education: N/A   Occupational History  . [employer]    Social History Main Topics  . Smoking status: Never Smoker   . Smokeless tobacco: Never Used  . Alcohol Use: Yes     Comment: 1-2 drinks per month.  . Drug Use: No     Comment: h/o marijuana use.  denies other drug use  . Sexually Active: Yes -- Male partner(s)   Other Topics Concern  . Not on file   Social History Narrative   Lives with his sister. Passive tobacco exposure from his sister.  [employer] for [employer]    Current Outpatient Prescriptions on File Prior to Visit  Medication Sig Dispense Refill  . acetaminophen (TYLENOL) 500 MG tablet Take 500 mg by mouth every 4 (four) hours as needed. For pain      . aspirin 81 MG tablet Take 1 tablet (81 mg total) by mouth daily.  1 tablet  0  . hydrochlorothiazide (HYDRODIURIL) 25 MG tablet TAKE 1 TABLET BY MOUTH EVERY DAY  90 tablet  0  . lisinopril (PRINIVIL,ZESTRIL) 20 MG tablet TAKE 1 TABLET (20 MG TOTAL) BY MOUTH DAILY.  90 tablet  0  . pioglitazone (ACTOS) 45 MG tablet TAKE 1 TABLET BY MOUTH EVERY DAY  90 tablet  0  . pantoprazole (PROTONIX) 40 MG tablet Take one by mouth twice daily before meals.  60 tablet  3  . ZETIA 10 MG tablet TAKE 1 TABLET EVERY DAY  30 tablet  0   No current facility-administered medications on file prior to visit.   No Known Allergies  ROS:  Denies fevers, URI symptoms, cough, shortness of breath, nausea, vomiting, bowel changes, abdominal pain, joint pains, back pain, depression, or any other concerns.  He was treated for H.Pylori back in the fall, and denies recurrent symptoms.  He denies any neuropathy (resolved).  PHYSICAL EXAM: BP 130/80  Pulse 68  Ht 5\' 10"  (1.778 m)  Wt 262 lb (118.842 kg)  BMI 37.59 kg/m2 Pleasant, obese male in no distress. He speaks very quickly, sometimes  speech is hard to understand when fast. Neck: no lymphadenopathy, thyromegaly or bruit Heart: regular rate and rhythm without murmur Lungs: clear bilaterally.  Some upper airway/bronchial wheezing noted (can hear in room without stethoscope).  He has good air movement, with no wheezing distally. No rales or ronchi Abdomen: soft, obese, nontender, no mass Extremities: no edema, 2+ pulses.  Long toenails, dry heels, some callouses Psych: normal mood, affect, hygiene and grooming Neuro: alert and oriented.  Cranial nerves grossly intact. Normal strength, sensation, gait   Lab Results  Component Value Date   HGBA1C 6.0 01/08/2013   ASSESSMENT/PLAN:  Type II or unspecified type diabetes mellitus without mention of complication, not stated as uncontrolled - controlled - Plan: HgB A1c, HM Diabetes Foot Exam, pioglitazone (ACTOS) 45 MG tablet, Comprehensive metabolic panel, Microalbumin / creatinine urine ratio, TSH  DYSLIPIDEMIA - noncompliant with meds; diet suboptimal.  restart Zetia, low cholesterol diet reviewed - Plan: ezetimibe (ZETIA) 10 MG tablet, Comprehensive metabolic panel, Lipid panel  ESSENTIAL HYPERTENSION - controlled - Plan: lisinopril-hydrochlorothiazide (PRINZIDE,ZESTORETIC) 20-25 MG per tablet, Comprehensive metabolic panel  Encounter for long-term (current) use of other medications - Plan: Comprehensive metabolic panel, Lipid panel, CBC with Differential  Special screening for malignant neoplasm of prostate - Plan: PSA  Erectile dysfunction - trial of daily cialis per pt request - Plan: Tadalafil 2.5 MG TABS  Obesity (BMI 30-39.9)  DM--well controlled. Schedule diabetic eye exam (due now). Discussed need for at least 30 minutes of exercise daily, and weight loss.   Schedule CPE for November. Will need PSA, TSH and microalbumin (yearly tests) along with other labs.  There is no point in checking lipids today, since he has been off meds.  Will check at physical, after  being back on Zetia

## 2013-01-08 NOTE — Patient Instructions (Signed)
Restart the Zetia.  It is very important that you keep your appointment for November, and get labs done ahead of time, so that we can make sure the medication is working.  Try and follow a low cholesterol diet (see below).  Call to schedule your yearly diabetic eye exam.  It is recommended that you get at least 30 minutes of aerobic exercise at least 5 days/week (for weight loss, you may need as much as 60-90 minutes). This can be any activity that gets your heart rate up. This can be divided in 10-15 minute intervals if needed, but try and build up your endurance at least once a week.  Weight bearing exercise is also recommended twice weekly.  Fat and Cholesterol Control Diet Cholesterol levels in your body are determined significantly by your diet. Cholesterol levels may also be related to heart disease. The following material helps to explain this relationship and discusses what you can do to help keep your heart healthy. Not all cholesterol is bad. Low-density lipoprotein (LDL) cholesterol is the "bad" cholesterol. It may cause fatty deposits to build up inside your arteries. High-density lipoprotein (HDL) cholesterol is "good." It helps to remove the "bad" LDL cholesterol from your blood. Cholesterol is a very important risk factor for heart disease. Other risk factors are high blood pressure, smoking, stress, heredity, and weight. The heart muscle gets its supply of blood through the coronary arteries. If your LDL cholesterol is high and your HDL cholesterol is low, you are at risk for having fatty deposits build up in your coronary arteries. This leaves less room through which blood can flow. Without sufficient blood and oxygen, the heart muscle cannot function properly and you may feel chest pains (angina pectoris). When a coronary artery closes up entirely, a part of the heart muscle may die causing a heart attack (myocardial infarction). CHECKING CHOLESTEROL When your caregiver sends your blood to  a lab to be examined for cholesterol, a complete lipid (fat) profile may be done. With this test, the total amount of cholesterol and levels of LDL and HDL are determined. Triglycerides are a type of fat that circulates in the blood. They can also be used to determine heart disease risk. The list below describes what the numbers should be: Test: Total Cholesterol.  Less than 200 mg/dl. Test: LDL "bad cholesterol."  Less than 100 mg/dl.  Less than 70 mg/dl if you are at very high risk of a heart attack or sudden cardiac death. Test: HDL "good cholesterol."  Greater than 50 mg/dl for women.  Greater than 40 mg/dl for men. Test: Triglycerides.  Less than 150 mg/dl. CONTROLLING CHOLESTEROL WITH DIET Although exercise and lifestyle factors are important, your diet is key. That is because certain foods are known to raise cholesterol and others to lower it. The goal is to balance foods for their effect on cholesterol and more importantly, to replace saturated and trans fat with other types of fat, such as monounsaturated fat, polyunsaturated fat, and omega-3 fatty acids. On average, a person should consume no more than 15 to 17 g of saturated fat daily. Saturated and trans fats are considered "bad" fats, and they will raise LDL cholesterol. Saturated fats are primarily found in animal products such as meats, butter, and cream. However, that does not mean you need to give up all your favorite foods. Today, there are good tasting, low-fat, low-cholesterol substitutes for most of the things you like to eat. Choose low-fat or nonfat alternatives. Choose round or  loin cuts of red meat. These types of cuts are lowest in fat and cholesterol. Chicken (without the skin), fish, veal, and ground [place] breast are great choices. Eliminate fatty meats, such as hot dogs and salami. Even shellfish have little or no saturated fat. Have a 3 oz (85 g) portion when you eat lean meat, poultry, or fish. Trans fats are also  called "partially hydrogenated oils." They are oils that have been scientifically manipulated so that they are solid at room temperature resulting in a longer shelf life and improved taste and texture of foods in which they are added. Trans fats are found in stick margarine, some tub margarines, cookies, crackers, and baked goods.  When baking and cooking, oils are a great substitute for butter. The monounsaturated oils are especially beneficial since it is believed they lower LDL and raise HDL. The oils you should avoid entirely are saturated tropical oils, such as coconut and palm.  Remember to eat a lot from food groups that are naturally free of saturated and trans fat, including fish, fruit, vegetables, beans, grains (barley, rice, couscous, bulgur wheat), and pasta (without cream sauces).  IDENTIFYING FOODS THAT LOWER CHOLESTEROL  Soluble fiber may lower your cholesterol. This type of fiber is found in fruits such as apples, vegetables such as broccoli, potatoes, and carrots, legumes such as beans, peas, and lentils, and grains such as barley. Foods fortified with plant sterols (phytosterol) may also lower cholesterol. You should eat at least 2 g per day of these foods for a cholesterol lowering effect.  Read package labels to identify low-saturated fats, trans fat free, and low-fat foods at the supermarket. Select cheeses that have only 2 to 3 g saturated fat per ounce. Use a heart-healthy tub margarine that is free of trans fats or partially hydrogenated oil. When buying baked goods (cookies, crackers), avoid partially hydrogenated oils. Breads and muffins should be made from whole grains (whole-wheat or whole oat flour, instead of "flour" or "enriched flour"). Buy non-creamy canned soups with reduced salt and no added fats.  FOOD PREPARATION TECHNIQUES  Never deep-fry. If you must fry, either stir-fry, which uses very little fat, or use non-stick cooking sprays. When possible, broil, bake, or roast  meats, and steam vegetables. Instead of putting butter or margarine on vegetables, use lemon and herbs, applesauce, and cinnamon (for squash and sweet potatoes), nonfat yogurt, salsa, and low-fat dressings for salads.  LOW-SATURATED FAT / LOW-FAT FOOD SUBSTITUTES Meats / Saturated Fat (g)  Avoid: Steak, marbled (3 oz/85 g) / 11 g  Choose: Steak, lean (3 oz/85 g) / 4 g  Avoid: Hamburger (3 oz/85 g) / 7 g  Choose: Hamburger, lean (3 oz/85 g) / 5 g  Avoid: Ham (3 oz/85 g) / 6 g  Choose: Ham, lean cut (3 oz/85 g) / 2.4 g  Avoid: Chicken, with skin, dark meat (3 oz/85 g) / 4 g  Choose: Chicken, skin removed, dark meat (3 oz/85 g) / 2 g  Avoid: Chicken, with skin, light meat (3 oz/85 g) / 2.5 g  Choose: Chicken, skin removed, light meat (3 oz/85 g) / 1 g Dairy / Saturated Fat (g)  Avoid: Whole milk (1 cup) / 5 g  Choose: Low-fat milk, 2% (1 cup) / 3 g  Choose: Low-fat milk, 1% (1 cup) / 1.5 g  Choose: Skim milk (1 cup) / 0.3 g  Avoid: Hard cheese (1 oz/28 g) / 6 g  Choose: Skim milk cheese (1 oz/28 g) / 2 to  3 g  Avoid: Cottage cheese, 4% fat (1 cup) / 6.5 g  Choose: Low-fat cottage cheese, 1% fat (1 cup) / 1.5 g  Avoid: Ice cream (1 cup) / 9 g  Choose: Sherbet (1 cup) / 2.5 g  Choose: Nonfat frozen yogurt (1 cup) / 0.3 g  Choose: Frozen fruit bar / trace  Avoid: Whipped cream (1 tbs) / 3.5 g  Choose: Nondairy whipped topping (1 tbs) / 1 g Condiments / Saturated Fat (g)  Avoid: Mayonnaise (1 tbs) / 2 g  Choose: Low-fat mayonnaise (1 tbs) / 1 g  Avoid: Butter (1 tbs) / 7 g  Choose: Extra light margarine (1 tbs) / 1 g  Avoid: Coconut oil (1 tbs) / 11.8 g  Choose: Olive oil (1 tbs) / 1.8 g  Choose: Corn oil (1 tbs) / 1.7 g  Choose: Safflower oil (1 tbs) / 1.2 g  Choose: Sunflower oil (1 tbs) / 1.4 g  Choose: Soybean oil (1 tbs) / 2.4 g  Choose: Canola oil (1 tbs) / 1 g Document Released: 06/12/2005 Document Revised: 09/04/2011 Document Reviewed:  12/01/2010 Exit[employer] Patient Information 2014 [place], [place].  Sexual and Urologic Problems of Diabetes Bladder problems and changes in sexual function are common as we age. Diabetes can cause these problems to start at an earlier age and can make these problems worse. By keeping your diabetes under control, you can lower your risk of sexual and urologic problems. CAUSES  Sexual and urologic problems of diabetes are caused by many factors. These factors can be related to nerve damage. SYMPTOMS  Difficulty with erections or ejaculation in men. Symptoms include:  Inability to have an erection firm enough for sexual intercourse.  Retrograde ejaculation. This is when part or all of a man's semen goes into the bladder instead of out the penis during ejaculation. He may notice that little semen is discharged during ejaculation or may become aware of the condition if fertility problems arise. Urine may appear cloudy. Problems with sexual response and vaginal lubrication in women. Symptoms can include:  Decreased or total lack of interest in sex.  Decreased or no sensation in the genital area.  Constant or occasional inability to reach orgasm.  Dryness in the vaginal area. This can lead to pain or discomfort during sex. Urologic problems.   Symptoms of an overactive bladder include:  Urinary urgency and frequency.  Getting up at night to urinate often.  Urine leakage.  The symptoms of a neurogenic bladder are less common, and may be more severe. These include:  Difficulty urinating.  Urinary tract infections.  Loss of the urge to urinate when the bladder is full.  Urine leakage.  Complete failure to empty your bladder (retention). Urinary tract infection symptoms:  Frequent urge to urinate.  Pain or burning in the bladder or urethra during urination.  Cloudy or reddish urine.  Fatigue or shakiness.  In women, pressure above the pubic bone.  In men, a feeling of fullness  in the rectum.  If the infection is in your kidneys, you may feel sick to your stomach, feel pain in your back or side, and have a fever. You may notice shaking and chills. DIAGNOSIS  Erectile Dysfunction.   Your caregiver may ask you about your medical history, the type and frequency of your sexual problems, your medications, your smoking and drinking habits, and other health conditions.  A physical exam and lab tests may be done.  Your blood glucose control and hormone levels will  be checked.  Your caregiver may also ask you whether you are depressed or have recently experienced upsetting changes in your life.  You may be asked to do a home test that checks for erections that occur during sleep. It is very common for men to have nocturnal erections and not be aware. Retrograde Ejaculation.   Your caregiver may take your medical history, do a physical exam, and examine your urine. Retrograde ejaculation is very common in men who have had prostate surgery. Tell your caregiver if you have had prostate surgery. Decreased Vaginal Lubrication or Absent Sexual Response.  Your caregiver will ask you about your medical history, any gynecologic conditions or infections, the type and frequency of your sexual problems, your medications, your smoking and drinking habits, and other health conditions.  A physical exam and lab tests may be done.  Your blood glucose control will be discussed.  Your caregiver may ask whether you might be pregnant or have reached menopause.  They may also ask whether you are depressed or have recently experienced upsetting changes in your life. Bladder Dysfunction.  Your caregiver will check both your nervous system (your brain and the nerves of the bladder) and the bladder itself.  Tests may include x-rays and an evaluation of bladder function (urodynamics). Urinary Tract Infections.  Your caregiver will ask for a urine sample. This sample will be analyzed for  bacteria and pus. If you have frequent urinary tract infections, your caregiver may order further tests.  An ultrasound exam may be done.  An intravenous pyelogram (IVP) may be done in order to examine images of your urinary tract.  A cystoscopy, which allows your caregiver to view the inside of the bladder, may be done. TREATMENT  Erectile Dysfunction.  Talk to your caregiver if you experience erectile dysfunction.  Treatments for erectile dysfunction caused by nerve damage (neuropathy) vary widely and can include:  Oral pills.  A vacuum pump.  Pellets placed in the urethra.  Shots directly into the penis.  Surgery to implant a device to aid in erection or to repair arteries.  Psychotherapy to reduce anxiety or address other issues may be necessary. Retrograde Ejaculation.  Medicine may be given that improves the muscle tone of the bladder neck.  A urologist experienced in infertility treatments may help to promote fertility. This may include collecting sperm from the urine and then using the sperm for artificial insemination. Decreased Vaginal Lubrication or Absent Sexual Response.  If you experience sexual problems or notice a change in your sexual response, talk to your caregiver.  Prescription or over-the-counter vaginal lubricant creams may be prescribed or recommended for dryness.  Techniques to treat decreased sexual response include changes in position and stimulation during sexual relations.  Psychological counseling may be helpful. Bladder Dysfunction.  Treatment for neurogenic bladder depends on the specific problem and its cause.  If the main problem is retention of urine in the bladder, treatment may involve medicine to promote better bladder emptying and behavior changes to promote more efficient urination (timed urination).  Sometimes, people may need to periodically insert a thin tube (catheter) through the urethra into the bladder to drain the  urine.  Learning how to tell when the bladder is full and how to massage the lower abdomen to fully empty the bladder can help.  If urinary leakage is the main problem, medicine or surgery can help.  Kegel exercises can strengthen the muscles that hold urine in the bladder. Urinary Tract Infection.  Early diagnosis  and treatment are important to prevent more serious infections (like a kidney infection).  Your caregiver may prescribe medicine that kills germs (antibiotic) based on the bacteria in your urine. Current recommendations are for a full 7-day course of antibiotic treatment in people with diabetes.  Kidney infections are more serious and may require several weeks of antibiotic treatment.  Drinking plenty of fluids will help prevent another infection.  If infections are associated with sexual intercourse, it may require prophylactic medicince prior to intercourse. RISK FACTORS Risk factors are conditions that increase your chances of getting a particular disease. The more risk factors you have, the greater your chances of developing that disease or condition. Diabetic neuropathy, including related sexual and urologic problems, appears to be more common in people who:  Have poor blood glucose control.  Have high levels of blood cholesterol.  Have high blood pressure.  Are overweight.  Are over the age of 47.  Smoke. PREVENTION   Keep your blood glucose, blood pressure, and cholesterol close to the target numbers your caregiver recommends.  Be physically active and maintain a healthy weight. This can also help prevent the long-term complications of diabetes.  Quit smoking. This will improve your health in many ways. If you quit smoking, you can lower your risk not only for nerve damage but also for heart attack, stroke, and kidney disease. FOR MORE INFORMATION  American Diabetes Association: www.diabetes.[employer] [employer]:  www.jdrf.org Document Released: 09/28/2008 Document Revised: 09/04/2011 Document Reviewed: 10/02/2008 [facility] Patient Information 2014 [place], [place].

## 2013-03-31 ENCOUNTER — Other Ambulatory Visit: Payer: Self-pay | Admitting: Family Medicine

## 2013-04-26 ENCOUNTER — Other Ambulatory Visit: Payer: Self-pay | Admitting: Family Medicine

## 2013-05-08 ENCOUNTER — Other Ambulatory Visit: Payer: Managed Care, Other (non HMO)

## 2013-05-12 ENCOUNTER — Encounter: Payer: Managed Care, Other (non HMO) | Admitting: Family Medicine

## 2013-05-15 ENCOUNTER — Encounter: Payer: Self-pay | Admitting: Family Medicine

## 2013-09-18 ENCOUNTER — Encounter: Payer: Self-pay | Admitting: Family Medicine

## 2013-09-18 ENCOUNTER — Ambulatory Visit (INDEPENDENT_AMBULATORY_CARE_PROVIDER_SITE_OTHER): Payer: No Typology Code available for payment source | Admitting: Family Medicine

## 2013-09-18 VITALS — BP 116/70 | HR 68 | Ht 69.5 in | Wt 266.0 lb

## 2013-09-18 DIAGNOSIS — N529 Male erectile dysfunction, unspecified: Secondary | ICD-10-CM

## 2013-09-18 DIAGNOSIS — J45909 Unspecified asthma, uncomplicated: Secondary | ICD-10-CM

## 2013-09-18 DIAGNOSIS — Z79899 Other long term (current) drug therapy: Secondary | ICD-10-CM

## 2013-09-18 DIAGNOSIS — E669 Obesity, unspecified: Secondary | ICD-10-CM

## 2013-09-18 DIAGNOSIS — E119 Type 2 diabetes mellitus without complications: Secondary | ICD-10-CM

## 2013-09-18 DIAGNOSIS — E785 Hyperlipidemia, unspecified: Secondary | ICD-10-CM

## 2013-09-18 DIAGNOSIS — I10 Essential (primary) hypertension: Secondary | ICD-10-CM

## 2013-09-18 DIAGNOSIS — Z125 Encounter for screening for malignant neoplasm of prostate: Secondary | ICD-10-CM

## 2013-09-18 LAB — CBC WITH DIFFERENTIAL/PLATELET
BASOS ABS: 0.1 10*3/uL (ref 0.0–0.1)
Basophils Relative: 2 % — ABNORMAL HIGH (ref 0–1)
EOS PCT: 8 % — AB (ref 0–5)
Eosinophils Absolute: 0.4 10*3/uL (ref 0.0–0.7)
HCT: 39.8 % (ref 39.0–52.0)
Hemoglobin: 13.1 g/dL (ref 13.0–17.0)
Lymphocytes Relative: 28 % (ref 12–46)
Lymphs Abs: 1.3 10*3/uL (ref 0.7–4.0)
MCH: 28.9 pg (ref 26.0–34.0)
MCHC: 32.9 g/dL (ref 30.0–36.0)
MCV: 87.9 fL (ref 78.0–100.0)
Monocytes Absolute: 0.5 10*3/uL (ref 0.1–1.0)
Monocytes Relative: 10 % (ref 3–12)
NEUTROS ABS: 2.4 10*3/uL (ref 1.7–7.7)
Neutrophils Relative %: 52 % (ref 43–77)
Platelets: 225 10*3/uL (ref 150–400)
RBC: 4.53 MIL/uL (ref 4.22–5.81)
RDW: 14.2 % (ref 11.5–15.5)
WBC: 4.6 10*3/uL (ref 4.0–10.5)

## 2013-09-18 LAB — COMPREHENSIVE METABOLIC PANEL
ALBUMIN: 4 g/dL (ref 3.5–5.2)
ALK PHOS: 48 U/L (ref 39–117)
ALT: 18 U/L (ref 0–53)
AST: 18 U/L (ref 0–37)
BUN: 43 mg/dL — AB (ref 6–23)
CALCIUM: 8.8 mg/dL (ref 8.4–10.5)
CHLORIDE: 108 meq/L (ref 96–112)
CO2: 23 mEq/L (ref 19–32)
Creat: 2.37 mg/dL — ABNORMAL HIGH (ref 0.50–1.35)
Glucose, Bld: 94 mg/dL (ref 70–99)
POTASSIUM: 5.2 meq/L (ref 3.5–5.3)
SODIUM: 140 meq/L (ref 135–145)
TOTAL PROTEIN: 6.6 g/dL (ref 6.0–8.3)
Total Bilirubin: 0.4 mg/dL (ref 0.2–1.2)

## 2013-09-18 LAB — POCT GLYCOSYLATED HEMOGLOBIN (HGB A1C): Hemoglobin A1C: 6.4

## 2013-09-18 LAB — LIPID PANEL
CHOL/HDL RATIO: 3.5 ratio
Cholesterol: 144 mg/dL (ref 0–200)
HDL: 41 mg/dL (ref >39)
LDL Cholesterol: 79 mg/dL (ref 0–99)
Triglycerides: 118 mg/dL (ref <150)
VLDL: 24 mg/dL (ref 0–40)

## 2013-09-18 MED ORDER — SILDENAFIL CITRATE 100 MG PO TABS: 50.0000 mg | ORAL_TABLET | Freq: Every day | ORAL | Status: DC | PRN

## 2013-09-18 MED ORDER — PIOGLITAZONE HCL 45 MG PO TABS: ORAL_TABLET | ORAL | Status: DC

## 2013-09-18 MED ORDER — LISINOPRIL-HYDROCHLOROTHIAZIDE 20-25 MG PO TABS: 1.0000 | ORAL_TABLET | Freq: Every day | ORAL | Status: DC

## 2013-09-18 NOTE — Progress Notes (Signed)
Chief Complaint  Patient presents with  . Diabetes    fasting med check.    He has been out of insurance from October until last month.  He reports being compliant with his medications (other than never restarting the Zetia). He had been going to a clinic, and getting meds monthly from them.  Diabetes follow-up: Blood sugars at home are running 120-140 before meals, 135-140 at bedtime. Denies hypoglycemia. Denies polydipsia and polyuria. Last eye exam was 2 years ago. Patient follows a low sugar diet and checks feet regularly without concerns. Previously had neuropathy in feet, but resolved. Denies burning, numbness or weakness in his feet (very rare).   Hypertension follow-up: Blood pressures elsewhere are 130's/80  Denies dizziness, headaches, chest pain. Denies side effects of medications; no cough, muscle cramps, edema.   Hyperlipidemia follow-up: He never restarted the Zetia, as prescribed at his last visit here in July 2014.  He changed to egg beaters instead of eggs, cut back on cheese, cut back on sausage.  Sometimes he doesn't eat breakfast.  He walks around his neighborhood at least once a week.  Asthma:  He got a flu shot this year.  He has inhalers, but hasn't needed to use them.  ED:  The daily cialis didn't seem to be effective.  Asking for prescription for Viagra, which was helpful in the past.  Required 100mg  for best effect.  Past Medical History  Diagnosis Date  . Hypertension   . Diabetes mellitus   . Back pain     resolved  . Dyslipidemia   . Asthma   . PUD (peptic ulcer disease)     11/2010; duodenal ulcer  . Anemia 11/2010    duodenal ulcer, s/p transfusion  . Orbital fracture 11/2010    left (due to fall)  . H. pylori infection 03/2012    seen on EGD with gastritis, duodenitis and duodenal ulcer  . Hiatal hernia 03/2012  . Erectile dysfunction    Past Surgical History  Procedure Laterality Date  . Esophagogastroduodenoscopy  04/01/12   History   Social  History  . Marital Status: Single    Spouse Name: N/A    Number of Children: 0  . Years of Education: N/A   Occupational History  . [employer]    Social History Main Topics  . Smoking status: Never Smoker   . Smokeless tobacco: Never Used  . Alcohol Use: Yes     Comment: 1-2 drinks per month.  . Drug Use: No     Comment: h/o marijuana use.  denies other drug use  . Sexual Activity: Yes    Partners: Female   Other Topics Concern  . Not on file   Social History Narrative   Lives with his sister. Passive tobacco exposure from his sister.  Lacinda Axon at [facility]    Outpatient Encounter Prescriptions as of 09/18/2013  Medication Sig Note  . aspirin 81 MG tablet Take 1 tablet (81 mg total) by mouth daily.   Marland Kitchen lisinopril-hydrochlorothiazide (PRINZIDE,ZESTORETIC) 20-25 MG per tablet Take 1 tablet by mouth daily.   . pioglitazone (ACTOS) 45 MG tablet TAKE 1 TABLET BY MOUTH EVERY DAY   . [DISCONTINUED] hydrochlorothiazide (HYDRODIURIL) 25 MG tablet TAKE 1 TABLET BY MOUTH EVERY DAY   . albuterol (PROVENTIL HFA;VENTOLIN HFA) 108 (90 BASE) MCG/ACT inhaler Inhale 2 puffs into the lungs every 6 (six) hours as needed for wheezing or shortness of breath. 09/18/2013: He has not needed to use this in a very  long time  . ezetimibe (ZETIA) 10 MG tablet TAKE 1 TABLET EVERY DAY 09/18/2013: He never got prescription from last visit in July; has been off meds since 10/2012  . Tadalafil 2.5 MG TABS Take 1 tablet (2.5 mg total) by mouth daily. 09/18/2013: ineffective  . [DISCONTINUED] acetaminophen (TYLENOL) 500 MG tablet Take 500 mg by mouth every 4 (four) hours as needed. For pain   . [DISCONTINUED] hydrochlorothiazide (HYDRODIURIL) 25 MG tablet TAKE 1 TABLET BY MOUTH EVERY DAY   . [DISCONTINUED] lisinopril (PRINIVIL,ZESTRIL) 20 MG tablet TAKE 1 TABLET (20 MG TOTAL) BY MOUTH DAILY.   . [DISCONTINUED] lisinopril (PRINIVIL,ZESTRIL) 20 MG tablet TAKE 1 TABLET (20 MG TOTAL) BY MOUTH DAILY.   . [DISCONTINUED]  pioglitazone (ACTOS) 45 MG tablet TAKE 1 TABLET BY MOUTH EVERY DAY    No Known Allergies  ROS:  Denies fevers, chills, headaches, dizziness, chest pain, shortness of breath, nausea, vomiting, heartburn, bowel changes, abdominal pain, urinary complaints.  +ED. Denies depression, insomnia.  PHYSICAL EXAM: BP 116/70  Pulse 68  Ht 5' 9.5" (1.765 m)  Wt 266 lb (120.657 kg)  BMI 38.73 kg/m2 Speaks very quickly, and is sometimes difficult to understand Well developed, pleasant,obese male in no distress HEENT:  PERRL, EOMI, fundi benign, OP clear Neck: no lymphadenopathy, thyromegaly, carotid bruit Heart: regular rate and rhythm without murmur Lungs: clear bilaterally Back: no spine or CVA tenderness Abdomen: obese, soft, nontender, no mass Extremities: no edema, 2+ pulse.  Normal sensation.  callouses at base of 1st MTP.  Onychomycosis (thickened, discolored nails; no ingrowing nails or infection) Neuro: alert and oriented.  Normal strength, gait, cranial nerves Psych: normal mood, affect, hygiene and grooming  Lab Results  Component Value Date   HGBA1C 6.4 09/18/2013    ASSESSMENT/PLAN:  Type II or unspecified type diabetes mellitus without mention of complication, not stated as uncontrolled - controlled - Plan: HgB A1c, Comprehensive metabolic panel, Microalbumin / creatinine urine ratio, TSH, pioglitazone (ACTOS) 45 MG tablet, HM Diabetes Foot Exam  DYSLIPIDEMIA - diet has improved.  check lipids, and if LDL>100 plan to start zetia as previously recommended (he was told to stop statins in past) - Plan: Comprehensive metabolic panel, Lipid panel  ESSENTIAL HYPERTENSION - borderline.  Low sodium diet, daily exercise and weight loss recommended - Plan: Comprehensive metabolic panel, lisinopril-hydrochlorothiazide (PRINZIDE,ZESTORETIC) 20-25 MG per tablet  Encounter for long-term (current) use of other medications - Plan: Comprehensive metabolic panel, Lipid panel, CBC with  Differential  Special screening for malignant neoplasm of prostate - schedule CPE for prostate exam - Plan: PSA  ASTHMA - intermittent; doing well/stable  Erectile dysfunction - daily cialis ineffective, Viagra previously effective at 100mg  dose - Plan: sildenafil (VIAGRA) 100 MG tablet  Obesity (BMI 30-39.9) - counseled re: importance of weight loss; daily exercise; healthy diet and portion control   Schedule diabetes eye exam  If you find that with regular exercise you are needing to use your rescue inhaler, then use it 15-20 minutes prior to exercise.   6 month med check; R/s CPE

## 2013-09-18 NOTE — Patient Instructions (Addendum)
Continue lowfat, low cholesterol diet. Continue your current medications--your diabetes and blood pressure are well controlled.  We will contact you with your blood test results, and likely tell you that you need to restart cholesterol-lowering medications (the zetia that you previously took).  It is very important for you to lose weight. Watch your portion sizes, make healthy food choices, and start exercising daily, at least 30 minutes each day.   Schedule diabetes eye exam  If you find that with regular exercise you are needing to use your rescue inhaler, then use it 15-20 minutes prior to exercise.

## 2013-09-19 LAB — MICROALBUMIN / CREATININE URINE RATIO
Creatinine, Urine: 88.7 mg/dL
MICROALB/CREAT RATIO: 213 mg/g — AB (ref 0.0–30.0)
Microalb, Ur: 18.89 mg/dL — ABNORMAL HIGH (ref 0.00–1.89)

## 2013-09-19 LAB — TSH: TSH: 1.352 u[IU]/mL (ref 0.350–4.500)

## 2013-09-19 LAB — PSA: PSA: 0.38 ng/mL (ref <=4.00)

## 2013-10-22 ENCOUNTER — Other Ambulatory Visit: Payer: Self-pay | Admitting: Nephrology

## 2013-10-22 DIAGNOSIS — N183 Chronic kidney disease, stage 3 unspecified: Secondary | ICD-10-CM

## 2013-10-24 ENCOUNTER — Other Ambulatory Visit: Payer: 59

## 2013-11-19 ENCOUNTER — Ambulatory Visit
Admission: RE | Admit: 2013-11-19 | Discharge: 2013-11-19 | Disposition: A | Discharge status: Home / Self Care | Payer: PRIVATE HEALTH INSURANCE | Source: Ambulatory Visit | Attending: Nephrology | Admitting: Nephrology

## 2013-11-19 DIAGNOSIS — N183 Chronic kidney disease, stage 3 unspecified: Secondary | ICD-10-CM

## 2015-02-17 ENCOUNTER — Encounter (HOSPITAL_COMMUNITY): Payer: Self-pay | Admitting: Family Medicine

## 2015-02-17 ENCOUNTER — Emergency Department (INDEPENDENT_AMBULATORY_CARE_PROVIDER_SITE_OTHER)
Admission: EM | Admit: 2015-02-17 | Discharge: 2015-02-17 | Disposition: A | Discharge status: Home / Self Care | Payer: Self-pay | Source: Home / Self Care | Attending: Family Medicine | Admitting: Family Medicine

## 2015-02-17 DIAGNOSIS — I10 Essential (primary) hypertension: Secondary | ICD-10-CM

## 2015-02-17 DIAGNOSIS — E119 Type 2 diabetes mellitus without complications: Secondary | ICD-10-CM

## 2015-02-17 MED ORDER — LISINOPRIL-HYDROCHLOROTHIAZIDE 20-25 MG PO TABS: 1.0000 | ORAL_TABLET | Freq: Every day | ORAL | Status: DC

## 2015-02-17 MED ORDER — PIOGLITAZONE HCL 45 MG PO TABS: ORAL_TABLET | ORAL | Status: DC

## 2015-02-17 NOTE — Discharge Instructions (Signed)
I have refilled your blood pressure and diabetes medicines today. Please make sure follow-up with your physician on September 9.

## 2015-02-17 NOTE — ED Provider Notes (Signed)
CSN: [id number]     Arrival date & time 02/17/15  1639 History   First MD Initiated Contact with Patient 02/17/15 1747     No chief complaint on file.  (Consider location/radiation/quality/duration/timing/severity/associated sxs/prior Treatment) HPI  Patient is a diabetic and hypertensive individual who states that he ran out of his medications and when he went to get them refilled realize there were no refills. Call his primary care physician's office who said that they can not refill them until he sees him in the clinic. Patient has a's appointment in their office on September 9. Denies any chest pain, short of breath, palpitations, neck stiffness, headache, shakiness or tremors, constipation, diarrhea. States that his sugar typically is in the 140 range and that his blood pressures in the 140/90 range. Patient states that he's been compliant with his medications until he ran out approximately 2 days ago. Past Medical History  Diagnosis Date  . Hypertension   . Diabetes mellitus   . Back pain     resolved  . Dyslipidemia   . Asthma   . PUD (peptic ulcer disease)     11/2010; duodenal ulcer  . Anemia 11/2010    duodenal ulcer, s/p transfusion  . Orbital fracture 11/2010    left (due to fall)  . H. pylori infection 03/2012    seen on EGD with gastritis, duodenitis and duodenal ulcer  . Hiatal hernia 03/2012  . Erectile dysfunction    Past Surgical History  Procedure Laterality Date  . Esophagogastroduodenoscopy  04/01/12   Family History  Problem Relation Age of Onset  . Heart disease Mother   . Alzheimer's disease Mother   . Heart disease Father 69    MI  . Diabetes Sister   . Colon cancer Neg Hx   . Esophageal cancer Neg Hx   . Rectal cancer Neg Hx   . Stomach cancer Neg Hx   . Stroke Neg Hx   . Cancer Maternal Grandmother     male cancer  . Diabetes Sister    Social History  Substance Use Topics  . Smoking status: Never Smoker   . Smokeless tobacco: Never Used  .  Alcohol Use: Yes     Comment: 1-2 drinks per month.    Review of Systems Per HPI with all other pertinent systems negative.    Allergies  Review of patient's allergies indicates no known allergies.  Home Medications   Prior to Admission medications   Medication Sig Start Date End Date Taking? Authorizing Provider  albuterol (PROVENTIL HFA;VENTOLIN HFA) 108 (90 BASE) MCG/ACT inhaler Inhale 2 puffs into the lungs every 6 (six) hours as needed for wheezing or shortness of breath.    Historical Provider, MD  aspirin 81 MG tablet Take 1 tablet (81 mg total) by mouth daily. 04/17/12   Rita Ohara, MD  ezetimibe (ZETIA) 10 MG tablet TAKE 1 TABLET EVERY DAY 01/08/13   Rita Ohara, MD  lisinopril-hydrochlorothiazide (PRINZIDE,ZESTORETIC) 20-25 MG per tablet Take 1 tablet by mouth daily. 02/17/15   Waldemar Dickens, MD  pioglitazone (ACTOS) 45 MG tablet TAKE 1 TABLET BY MOUTH EVERY DAY 02/17/15   Waldemar Dickens, MD  sildenafil (VIAGRA) 100 MG tablet Take 0.5-1 tablets (50-100 mg total) by mouth daily as needed for erectile dysfunction. 09/18/13   Rita Ohara, MD   BP 149/89 mmHg  Pulse 72  Temp(Src) 98.2 F (36.8 C) (Oral)  Resp 18  SpO2 100% Physical Exam Physical Exam  Constitutional: oriented to person, place,  and time. appears well-developed and well-nourished. No distress.  HENT:  Head: Normocephalic and atraumatic.  Eyes: EOMI. PERRL.  Neck: Normal range of motion.  Cardiovascular: RRR, no m/r/g, 2+ distal pulses,  Pulmonary/Chest: Effort normal and breath sounds normal. No respiratory distress.  Abdominal: Soft. Bowel sounds are normal. NonTTP, no distension.  Musculoskeletal: Normal range of motion. Non ttp, no effusion.  Neurological: alert and oriented to person, place, and time.  Skin: Skin is warm. No rash noted. non diaphoretic.  Psychiatric: normal mood and affect. behavior is normal. Judgment and thought content normal.   ED Course  Procedures (including critical care  time) Labs Review Labs Reviewed - No data to display  Imaging Review No results found.   MDM   1. Essential hypertension   2. Diabetes mellitus without complication    Refer lisinopril Hydrocort thiazide and Actos. Patient is in no acute distress.   Waldemar Dickens, MD 02/17/15 [phone]

## 2015-03-26 ENCOUNTER — Encounter (HOSPITAL_COMMUNITY): Payer: Self-pay | Admitting: Emergency Medicine

## 2015-03-26 ENCOUNTER — Emergency Department (HOSPITAL_COMMUNITY): Payer: PRIVATE HEALTH INSURANCE

## 2015-03-26 ENCOUNTER — Emergency Department (HOSPITAL_COMMUNITY)
Admission: EM | Admit: 2015-03-26 | Discharge: 2015-03-26 | Disposition: A | Discharge status: Home / Self Care | Payer: PRIVATE HEALTH INSURANCE | Attending: Emergency Medicine | Admitting: Emergency Medicine

## 2015-03-26 DIAGNOSIS — R0602 Shortness of breath: Secondary | ICD-10-CM | POA: Diagnosis present

## 2015-03-26 DIAGNOSIS — Z8711 Personal history of peptic ulcer disease: Secondary | ICD-10-CM | POA: Insufficient documentation

## 2015-03-26 DIAGNOSIS — I10 Essential (primary) hypertension: Secondary | ICD-10-CM | POA: Diagnosis not present

## 2015-03-26 DIAGNOSIS — Z87438 Personal history of other diseases of male genital organs: Secondary | ICD-10-CM | POA: Diagnosis not present

## 2015-03-26 DIAGNOSIS — Z862 Personal history of diseases of the blood and blood-forming organs and certain disorders involving the immune mechanism: Secondary | ICD-10-CM | POA: Insufficient documentation

## 2015-03-26 DIAGNOSIS — J45901 Unspecified asthma with (acute) exacerbation: Secondary | ICD-10-CM | POA: Diagnosis not present

## 2015-03-26 DIAGNOSIS — Z8719 Personal history of other diseases of the digestive system: Secondary | ICD-10-CM | POA: Diagnosis not present

## 2015-03-26 DIAGNOSIS — Z8619 Personal history of other infectious and parasitic diseases: Secondary | ICD-10-CM | POA: Diagnosis not present

## 2015-03-26 DIAGNOSIS — Z79899 Other long term (current) drug therapy: Secondary | ICD-10-CM | POA: Diagnosis not present

## 2015-03-26 DIAGNOSIS — E119 Type 2 diabetes mellitus without complications: Secondary | ICD-10-CM | POA: Diagnosis not present

## 2015-03-26 DIAGNOSIS — Z8781 Personal history of (healed) traumatic fracture: Secondary | ICD-10-CM | POA: Diagnosis not present

## 2015-03-26 LAB — COMPREHENSIVE METABOLIC PANEL
ALBUMIN: 3.5 g/dL (ref 3.5–5.0)
ALT: 26 U/L (ref 17–63)
ANION GAP: 3 — AB (ref 5–15)
AST: 28 U/L (ref 15–41)
Alkaline Phosphatase: 55 U/L (ref 38–126)
BILIRUBIN TOTAL: 0.5 mg/dL (ref 0.3–1.2)
BUN: 23 mg/dL — ABNORMAL HIGH (ref 6–20)
CHLORIDE: 111 mmol/L (ref 101–111)
CO2: 27 mmol/L (ref 22–32)
Calcium: 8.9 mg/dL (ref 8.9–10.3)
Creatinine, Ser: 1.59 mg/dL — ABNORMAL HIGH (ref 0.61–1.24)
GFR calc Af Amer: 54 mL/min — ABNORMAL LOW (ref >60)
GFR calc non Af Amer: 47 mL/min — ABNORMAL LOW (ref >60)
GLUCOSE: 104 mg/dL — AB (ref 65–99)
POTASSIUM: 4.2 mmol/L (ref 3.5–5.1)
SODIUM: 141 mmol/L (ref 135–145)
TOTAL PROTEIN: 6.4 g/dL — AB (ref 6.5–8.1)

## 2015-03-26 LAB — CBC WITH DIFFERENTIAL/PLATELET
BASOS ABS: 0 10*3/uL (ref 0.0–0.1)
BASOS PCT: 0 %
EOS ABS: 0.2 10*3/uL (ref 0.0–0.7)
EOS PCT: 5 %
HEMATOCRIT: 38.9 % — AB (ref 39.0–52.0)
Hemoglobin: 12.3 g/dL — ABNORMAL LOW (ref 13.0–17.0)
Lymphocytes Relative: 17 %
Lymphs Abs: 0.8 10*3/uL (ref 0.7–4.0)
MCH: 29.2 pg (ref 26.0–34.0)
MCHC: 31.6 g/dL (ref 30.0–36.0)
MCV: 92.4 fL (ref 78.0–100.0)
MONO ABS: 0.5 10*3/uL (ref 0.1–1.0)
MONOS PCT: 12 %
Neutro Abs: 3 10*3/uL (ref 1.7–7.7)
Neutrophils Relative %: 66 %
PLATELETS: 143 10*3/uL — AB (ref 150–400)
RBC: 4.21 MIL/uL — ABNORMAL LOW (ref 4.22–5.81)
RDW: 13.8 % (ref 11.5–15.5)
WBC: 4.5 10*3/uL (ref 4.0–10.5)

## 2015-03-26 LAB — TROPONIN I: Troponin I: <0.03 ng/mL (ref <0.031)

## 2015-03-26 MED ORDER — PREDNISONE 20 MG PO TABS
60.0000 mg | ORAL_TABLET | Freq: Once | ORAL | Status: AC
  Administered 2015-03-26: 60 mg via ORAL
  Filled 2015-03-26: qty 3

## 2015-03-26 MED ORDER — ALBUTEROL SULFATE (2.5 MG/3ML) 0.083% IN NEBU
5.0000 mg | INHALATION_SOLUTION | Freq: Once | RESPIRATORY_TRACT | Status: AC
  Administered 2015-03-26: 5 mg via RESPIRATORY_TRACT

## 2015-03-26 MED ORDER — ALBUTEROL SULFATE (2.5 MG/3ML) 0.083% IN NEBU
5.0000 mg | INHALATION_SOLUTION | Freq: Once | RESPIRATORY_TRACT | Status: AC
  Administered 2015-03-26: 5 mg via RESPIRATORY_TRACT
  Filled 2015-03-26: qty 6

## 2015-03-26 MED ORDER — ALBUTEROL SULFATE (2.5 MG/3ML) 0.083% IN NEBU
INHALATION_SOLUTION | RESPIRATORY_TRACT | Status: AC
  Filled 2015-03-26: qty 6

## 2015-03-26 NOTE — ED Provider Notes (Signed)
The patient is a 59 year old male, he has a history of asthma, denies other medical problems though he states that he takes daily blood pressure medications. According to the medical record he does not come to the hospital very often, he has had normal thyroid testing a urine half ago, hemoglobin A1c was also normal just prior to that. He reports approximately 2 days of worsening shortness of breath, increased productive cough, states that he was unable to sleep last night because of the shortness of breath, felt like he was pacing the floor, home medications including albuterol did not improve his symptoms that much. On exam the patient has a congested sounding voice, occasional coughing, lung sounds in all fields with only mild wheezing but no rales or rhonchi, speaks in full sentences, no significant peripheral edema or asymmetry of the legs, no obvious JVD, oxygenation 96-100% on room air without supplemental oxygen. Will order chest x-ray and EKG as long as the patient improves on albuterol anticipate discharge with infectious etiology or bronchitis. The patient is in agreement with the plan.  Medical screening examination/treatment/procedure(s) were conducted as a shared visit with non-physician practitioner(s) and myself.  I personally evaluated the patient during the encounter.  Clinical Impression:   Final diagnoses:  Shortness of breath         Noemi Chapel, MD 03/26/15 1540

## 2015-03-26 NOTE — ED Provider Notes (Signed)
CSN: [id number]     Arrival date & time 03/26/15  0828 History   First MD Initiated Contact with Patient 03/26/15 [phone]     Chief Complaint  Patient presents with  . Asthma     HPI   Anthony Owens is a 59 y.o. male presents today with a complaint of shortness of breath that came on yesterday. Patient states it "feels like my asthma." Pt denies any fever/chills, nausea/vomiting, dizziness, orthopnea, or any recent illness.  Pt states he tried both his inhaler and his albuterol nebulizer, but had no relief. Pt admits to a cough, but states this is normal for him with no increase in frequency, sputum production or effort. Pt denies smoking or working in an area with inhalation hazards.    Past Medical History  Diagnosis Date  . Hypertension   . Diabetes mellitus   . Back pain     resolved  . Dyslipidemia   . Asthma   . PUD (peptic ulcer disease)     11/2010; duodenal ulcer  . Anemia 11/2010    duodenal ulcer, s/p transfusion  . Orbital fracture 11/2010    left (due to fall)  . H. pylori infection 03/2012    seen on EGD with gastritis, duodenitis and duodenal ulcer  . Hiatal hernia 03/2012  . Erectile dysfunction    Past Surgical History  Procedure Laterality Date  . Esophagogastroduodenoscopy  04/01/12   Family History  Problem Relation Age of Onset  . Heart disease Mother   . Alzheimer's disease Mother   . Heart disease Father 56    MI  . Diabetes Sister   . Colon cancer Neg Hx   . Esophageal cancer Neg Hx   . Rectal cancer Neg Hx   . Stomach cancer Neg Hx   . Stroke Neg Hx   . Cancer Maternal Grandmother     male cancer  . Diabetes Sister    Social History  Substance Use Topics  . Smoking status: Never Smoker   . Smokeless tobacco: Never Used  . Alcohol Use: Yes     Comment: 1-2 drinks per month.    Review of Systems  Constitutional: Negative for fever, chills, diaphoresis and fatigue.  HENT: Negative.   Respiratory: Positive for shortness of breath and  wheezing.   Gastrointestinal: Negative.   Musculoskeletal: Negative.   Neurological: Negative for dizziness, syncope, light-headedness and headaches.      Allergies  Review of patient's allergies indicates no known allergies.  Home Medications   Prior to Admission medications   Medication Sig Start Date End Date Taking? Authorizing Provider  albuterol (PROVENTIL HFA;VENTOLIN HFA) 108 (90 BASE) MCG/ACT inhaler Inhale 2 puffs into the lungs every 6 (six) hours as needed for wheezing or shortness of breath.   Yes Historical Provider, MD  lisinopril-hydrochlorothiazide (PRINZIDE,ZESTORETIC) 20-25 MG per tablet Take 1 tablet by mouth daily. 02/17/15  Yes Waldemar Dickens, MD  pioglitazone (ACTOS) 45 MG tablet TAKE 1 TABLET BY MOUTH EVERY DAY 02/17/15  Yes Waldemar Dickens, MD   BP 174/90 mmHg  Pulse 99  Temp(Src) 98.5 F (36.9 C) (Oral)  Resp 20  SpO2 94% Physical Exam  Constitutional: He is oriented to person, place, and time. He appears well-developed and well-nourished. No distress.  HENT:  Head: Normocephalic and atraumatic.  Eyes: Conjunctivae and EOM are normal. Pupils are equal, round, and reactive to light.  Cardiovascular: Normal rate, regular rhythm and normal heart sounds.   Pulmonary/Chest: Effort normal. No  respiratory distress.  Wheezes in all lung fields. Mild tachypnea, speaks in full sentences without accessory muscle use.  Abdominal: Soft. Bowel sounds are normal.  Musculoskeletal: He exhibits no edema or tenderness.  Neurological: He is alert and oriented to person, place, and time.  Skin: Skin is warm and dry. He is not diaphoretic.  Nursing note and vitals reviewed.   ED Course  Procedures (including critical care time) Labs Review Labs Reviewed  CBC WITH DIFFERENTIAL/PLATELET - Abnormal; Notable for the following:    RBC 4.21 (*)    Hemoglobin 12.3 (*)    HCT 38.9 (*)    Platelets 143 (*)    All other components within normal limits  COMPREHENSIVE  METABOLIC PANEL - Abnormal; Notable for the following:    Glucose, Bld 104 (*)    BUN 23 (*)    Creatinine, Ser 1.59 (*)    Total Protein 6.4 (*)    GFR calc non Af Amer 47 (*)    GFR calc Af Amer 54 (*)    Anion gap 3 (*)    All other components within normal limits  TROPONIN I    Imaging Review Dg Chest 2 View  03/26/2015   CLINICAL DATA:  Cough, congestion, chills  EXAM: CHEST  2 VIEW  COMPARISON:  12/06/2010  FINDINGS: The heart size and mediastinal contours are within normal limits. Both lungs are clear. The visualized skeletal structures are unremarkable.  IMPRESSION: No active cardiopulmonary disease.   Electronically Signed   By: Kathreen Devoid   On: 03/26/2015 10:40   I have personally reviewed and evaluated these images and lab results as part of my medical decision-making.   EKG Interpretation   Date/Time:  Friday March 26 2015 09:38:07 EDT Ventricular Rate:  73 PR Interval:  151 QRS Duration: 89 QT Interval:  375 QTC Calculation: 413 R Axis:   86 Text Interpretation:  Sinus rhythm Nonspecific ST abnormality Abnormal ekg  Since last tracing Nonspecific ST abnormality NOW PRESENT Confirmed by  Sabra Heck  MD, BRIAN ([id number]) on 03/26/2015 9:42:46 AM      MDM   Final diagnoses:  Shortness of breath    Anthony Mussel A. Freier presents with shortness of breath and chest pain beginning yesterday.   Findings and plan of care discussed with Dr. Sabra Heck.  CBC found to have Hgb of 12.3 with patient seeming to run around 13-14 on previous checks. Will advise outpatient recheck of CBC.  EKG reveals ST abnormality in II,III, avF and V4-V6, which is a change from previous EKGs. No ischemic changes.  Troponin ordered.  9:54 AM Pt reports decreased shortness of breath and coughing with improvement in chest tightness as well following first albuterol treatment. Wheezing still present in all lung fields. Improvement in tachypnea.  11:18 AM  Patient still without distress. Awaiting  Troponin result prior to discharge. Patient advised he would need to followup with his PCP within the next few days to assure his medications are up to date.  11:54 AM  Troponin is negative. Patient to be discharged.   Lorayne Bender, PA-C 03/26/15 1158  Noemi Chapel, MD 03/26/15 1540

## 2015-03-26 NOTE — ED Notes (Signed)
Pt states that yesterday at work his asthma flared up and got worse over the night. Pt state that he has inhaler at home and has used it. Pt states that he has cough that is sometimes productive with white phlegm.

## 2015-03-26 NOTE — Discharge Instructions (Signed)
Followup with PCP to review and renew medications.   Return to ED should problems recur.

## 2016-03-16 ENCOUNTER — Encounter (HOSPITAL_COMMUNITY): Payer: Self-pay | Admitting: Emergency Medicine

## 2016-03-16 ENCOUNTER — Emergency Department (HOSPITAL_COMMUNITY)
Admission: EM | Admit: 2016-03-16 | Discharge: 2016-03-16 | Disposition: A | Discharge status: Home / Self Care | Payer: PRIVATE HEALTH INSURANCE | Attending: Emergency Medicine | Admitting: Emergency Medicine

## 2016-03-16 DIAGNOSIS — I10 Essential (primary) hypertension: Secondary | ICD-10-CM | POA: Insufficient documentation

## 2016-03-16 DIAGNOSIS — E119 Type 2 diabetes mellitus without complications: Secondary | ICD-10-CM | POA: Diagnosis not present

## 2016-03-16 DIAGNOSIS — J45909 Unspecified asthma, uncomplicated: Secondary | ICD-10-CM | POA: Diagnosis present

## 2016-03-16 DIAGNOSIS — Z79899 Other long term (current) drug therapy: Secondary | ICD-10-CM | POA: Insufficient documentation

## 2016-03-16 DIAGNOSIS — Z7984 Long term (current) use of oral hypoglycemic drugs: Secondary | ICD-10-CM | POA: Insufficient documentation

## 2016-03-16 DIAGNOSIS — J45901 Unspecified asthma with (acute) exacerbation: Secondary | ICD-10-CM | POA: Diagnosis not present

## 2016-03-16 MED ORDER — ALBUTEROL SULFATE (2.5 MG/3ML) 0.083% IN NEBU: 2.5000 mg | INHALATION_SOLUTION | RESPIRATORY_TRACT | 0 refills | Status: DC | PRN

## 2016-03-16 MED ORDER — ALBUTEROL SULFATE (2.5 MG/3ML) 0.083% IN NEBU
5.0000 mg | INHALATION_SOLUTION | Freq: Once | RESPIRATORY_TRACT | Status: AC
  Administered 2016-03-16: 5 mg via RESPIRATORY_TRACT
  Filled 2016-03-16: qty 6

## 2016-03-16 MED ORDER — PREDNISONE 20 MG PO TABS
60.0000 mg | ORAL_TABLET | Freq: Once | ORAL | Status: AC
  Administered 2016-03-16: 60 mg via ORAL
  Filled 2016-03-16: qty 3

## 2016-03-16 MED ORDER — PREDNISONE 20 MG PO TABS: ORAL_TABLET | ORAL | 0 refills | Status: DC

## 2016-03-16 NOTE — ED Notes (Signed)
Called respiratory therapy for breathing treatment.

## 2016-03-16 NOTE — ED Provider Notes (Signed)
[facility] DEPT Provider Note   CSN: [id number] Arrival date & time: 03/16/16  0200     History   Chief Complaint Chief Complaint  Patient presents with  . Asthma    HPI Anthony Owens is a 60 y.o. male.  The history is provided by the patient.  Asthma  This is a recurrent problem. The current episode started 12 to 24 hours ago. The problem occurs constantly. The problem has been rapidly improving. Pertinent negatives include no chest pain and no abdominal pain. Nothing aggravates the symptoms. Relieved by: albuterol neb in the waiting room. Treatments tried: albuterol while waiting. The treatment provided significant relief.    Past Medical History:  Diagnosis Date  . Anemia 11/2010   duodenal ulcer, s/p transfusion  . Asthma   . Back pain    resolved  . Diabetes mellitus   . Dyslipidemia   . Erectile dysfunction   . H. pylori infection 03/2012   seen on EGD with gastritis, duodenitis and duodenal ulcer  . Hiatal hernia 03/2012  . Hypertension   . Orbital fracture ([place]) 11/2010   left (due to fall)  . PUD (peptic ulcer disease)    11/2010; duodenal ulcer    Patient Active Problem List   Diagnosis Date Noted  . Obesity (BMI 30-39.9) 01/08/2013  . Erectile dysfunction 01/08/2013  . Type II or unspecified type diabetes mellitus without mention of complication, not stated as uncontrolled 01/08/2013  . 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 ANEMIA, IRON DEFICIENCY 03/11/2008  . ESSENTIAL HYPERTENSION 01/21/2008  . ASTHMA 01/21/2008  . PROTEINURIA 01/21/2008  . DYSLIPIDEMIA 03/26/2007    Past Surgical History:  Procedure Laterality Date  . ESOPHAGOGASTRODUODENOSCOPY  04/01/12       Home Medications    Prior to Admission medications   Medication Sig Start Date End Date Taking? Authorizing Provider  albuterol (PROVENTIL) (2.5 MG/3ML) 0.083%  nebulizer solution Take 3 mLs (2.5 mg total) by nebulization every 4 (four) hours as needed for wheezing or shortness of breath. 03/16/16   Riah Kehoe, MD  lisinopril-hydrochlorothiazide (PRINZIDE,ZESTORETIC) 20-25 MG per tablet Take 1 tablet by mouth daily. 02/17/15   Waldemar Dickens, MD  pioglitazone (ACTOS) 45 MG tablet TAKE 1 TABLET BY MOUTH EVERY DAY 02/17/15   Waldemar Dickens, MD  predniSONE (DELTASONE) 20 MG tablet 3 tabs po day one, then 2 po daily x 4 days 03/16/16   Liisa Picone, MD    Family History Family History  Problem Relation Age of Onset  . Heart disease Mother   . Alzheimer's disease Mother   . Heart disease Father 41    MI  . Diabetes Sister   . Diabetes Sister   . Cancer Maternal Grandmother     male cancer  . Colon cancer Neg Hx   . Esophageal cancer Neg Hx   . Rectal cancer Neg Hx   . Stomach cancer Neg Hx   . Stroke Neg Hx     Social History Social History  Substance Use Topics  . Smoking status: Never Smoker  . Smokeless tobacco: Never Used  . Alcohol use No     Allergies   Review of patient's allergies indicates no known allergies.   Review of Systems Review of Systems  Constitutional: Negative for fever.  Respiratory: Positive for wheezing. Negative for cough.   Cardiovascular: Negative for chest pain, palpitations and leg swelling.  Gastrointestinal: Negative  for abdominal pain.  All other systems reviewed and are negative.    Physical Exam Updated Vital Signs BP 117/81   Pulse 78   Temp 98.2 F (36.8 C) (Oral)   Resp 18   Ht 5\' 11"  (1.803 m)   Wt 260 lb (117.9 kg)   SpO2 100%   BMI 36.26 kg/m   Physical Exam  Constitutional: He is oriented to person, place, and time. He appears well-developed and well-nourished. No distress.  HENT:  Head: Normocephalic and atraumatic.  Mouth/Throat: No oropharyngeal exudate.  Eyes: EOM are normal. Pupils are equal, round, and reactive to light.  Neck: Normal range of motion. Neck supple.   Cardiovascular: Normal rate, regular rhythm and normal heart sounds.   Pulmonary/Chest: Effort normal and breath sounds normal. No stridor. No respiratory distress. He has no wheezes. He has no rales.  Abdominal: Soft. Bowel sounds are normal. He exhibits no mass. There is no tenderness. There is no rebound and no guarding.  Musculoskeletal: Normal range of motion. He exhibits no edema.  Neurological: He is alert and oriented to person, place, and time.  Skin: Skin is warm and dry. Capillary refill takes less than 2 seconds.     ED Treatments / Results   Vitals:   03/16/16 0208 03/16/16 0524  BP: 115/74 117/81  Pulse: 78 78  Resp: 23 18  Temp: 98.2 F (36.8 C)    Medications  albuterol (PROVENTIL) (2.5 MG/3ML) 0.083% nebulizer solution 5 mg (5 mg Nebulization Given 03/16/16 0227)  predniSONE (DELTASONE) tablet 60 mg (60 mg Oral Given 03/16/16 0522)    Radiology No results found.  Procedures Procedures (including critical care time)  Medications Ordered in ED Medications  albuterol (PROVENTIL) (2.5 MG/3ML) 0.083% nebulizer solution 5 mg (5 mg Nebulization Given 03/16/16 0227)  predniSONE (DELTASONE) tablet 60 mg (60 mg Oral Given 03/16/16 0522)     Initial Impression / Assessment and Plan / ED Course  I have reviewed the triage vital signs and the nursing notes.  Pertinent labs & imaging results that were available during my care of the patient were reviewed by me and considered in my medical decision making (see chart for details).  Clinical Course    Clear lungs post treatment.  Feels better will refill the albuterol nebs and prednisone    Final Clinical Impressions(s) / ED Diagnoses   Final diagnoses:  Asthma exacerbation   All questions answered to patient's satisfaction. Based on history and exam patient has been appropriately medically screened and emergency conditions excluded. Patient is stable for discharge at this time. Follow up with your PMD for recheck in  2 days and strict return precautions given   New Prescriptions Discharge Medication List as of 03/16/2016  5:47 AM    START taking these medications   Details  albuterol (PROVENTIL) (2.5 MG/3ML) 0.083% nebulizer solution Take 3 mLs (2.5 mg total) by nebulization every 4 (four) hours as needed for wheezing or shortness of breath., Starting Thu 03/16/2016, Print    predniSONE (DELTASONE) 20 MG tablet 3 tabs po day one, then 2 po daily x 4 days, Print         Khaden Gater, MD 03/16/16 [phone]

## 2016-03-16 NOTE — ED Triage Notes (Signed)
Pt comes from home with complaints of an asthma flare up.  States he has a breathing treatment machine at home but is out of medicine. States he thinks change of weather and some exertion may have caused the flare up.  Audible wheezing on assessment.

## 2016-03-16 NOTE — ED Notes (Signed)
Respiratory at bedside.

## 2016-03-16 NOTE — ED Notes (Signed)
Bed: WA05 Expected date:  Expected time:  Means of arrival:  Comments: 

## 2019-07-22 ENCOUNTER — Ambulatory Visit: Payer: PRIVATE HEALTH INSURANCE | Attending: Internal Medicine

## 2019-07-22 DIAGNOSIS — Z20822 Contact with and (suspected) exposure to covid-19: Secondary | ICD-10-CM

## 2019-07-23 ENCOUNTER — Telehealth: Payer: Self-pay | Admitting: *Deleted

## 2019-07-23 LAB — NOVEL CORONAVIRUS, NAA: SARS-CoV-2, NAA: NOT DETECTED

## 2019-07-23 NOTE — Telephone Encounter (Signed)
Pt notified that he is negative for covid 19. He voiced understanding.

## 2020-06-09 ENCOUNTER — Encounter (HOSPITAL_COMMUNITY): Payer: Self-pay | Admitting: *Deleted

## 2020-06-09 ENCOUNTER — Emergency Department (HOSPITAL_COMMUNITY)
Admission: EM | Admit: 2020-06-09 | Discharge: 2020-06-10 | Discharge status: Against Medical Advice | Payer: Managed Care, Other (non HMO) | Attending: Emergency Medicine | Admitting: Emergency Medicine

## 2020-06-09 ENCOUNTER — Other Ambulatory Visit: Payer: Self-pay

## 2020-06-09 DIAGNOSIS — J45909 Unspecified asthma, uncomplicated: Secondary | ICD-10-CM | POA: Diagnosis not present

## 2020-06-09 DIAGNOSIS — I10 Essential (primary) hypertension: Secondary | ICD-10-CM | POA: Insufficient documentation

## 2020-06-09 DIAGNOSIS — Z79899 Other long term (current) drug therapy: Secondary | ICD-10-CM | POA: Insufficient documentation

## 2020-06-09 DIAGNOSIS — E119 Type 2 diabetes mellitus without complications: Secondary | ICD-10-CM | POA: Insufficient documentation

## 2020-06-09 DIAGNOSIS — N179 Acute kidney failure, unspecified: Secondary | ICD-10-CM | POA: Insufficient documentation

## 2020-06-09 DIAGNOSIS — E875 Hyperkalemia: Secondary | ICD-10-CM

## 2020-06-09 DIAGNOSIS — R799 Abnormal finding of blood chemistry, unspecified: Secondary | ICD-10-CM | POA: Diagnosis present

## 2020-06-09 LAB — COMPREHENSIVE METABOLIC PANEL
ALT: 32 U/L (ref 0–44)
AST: 23 U/L (ref 15–41)
Albumin: 2.8 g/dL — ABNORMAL LOW (ref 3.5–5.0)
Alkaline Phosphatase: 80 U/L (ref 38–126)
Anion gap: 7 (ref 5–15)
BUN: 54 mg/dL — ABNORMAL HIGH (ref 8–23)
CO2: 23 mmol/L (ref 22–32)
Calcium: 8 mg/dL — ABNORMAL LOW (ref 8.9–10.3)
Chloride: 111 mmol/L (ref 98–111)
Creatinine, Ser: 4.3 mg/dL — ABNORMAL HIGH (ref 0.61–1.24)
GFR, Estimated: 15 mL/min — ABNORMAL LOW (ref >60)
Glucose, Bld: 78 mg/dL (ref 70–99)
Potassium: 5.4 mmol/L — ABNORMAL HIGH (ref 3.5–5.1)
Sodium: 141 mmol/L (ref 135–145)
Total Bilirubin: 0.6 mg/dL (ref 0.3–1.2)
Total Protein: 5.7 g/dL — ABNORMAL LOW (ref 6.5–8.1)

## 2020-06-09 LAB — CBC
HCT: 38.5 % — ABNORMAL LOW (ref 39.0–52.0)
Hemoglobin: 11.4 g/dL — ABNORMAL LOW (ref 13.0–17.0)
MCH: 29.1 pg (ref 26.0–34.0)
MCHC: 29.6 g/dL — ABNORMAL LOW (ref 30.0–36.0)
MCV: 98.2 fL (ref 80.0–100.0)
Platelets: 183 10*3/uL (ref 150–400)
RBC: 3.92 MIL/uL — ABNORMAL LOW (ref 4.22–5.81)
RDW: 13.5 % (ref 11.5–15.5)
WBC: 5.3 10*3/uL (ref 4.0–10.5)
nRBC: 0 % (ref 0.0–0.2)

## 2020-06-09 NOTE — ED Triage Notes (Signed)
The pt had a physical on Monday and he had lab work drawn  Today he was called and told that his potassium was too high  No problems at present no history of the same

## 2020-06-10 LAB — I-STAT CHEM 8, ED
BUN: 51 mg/dL — ABNORMAL HIGH (ref 8–23)
Calcium, Ion: 1.25 mmol/L (ref 1.15–1.40)
Chloride: 112 mmol/L — ABNORMAL HIGH (ref 98–111)
Creatinine, Ser: 4.4 mg/dL — ABNORMAL HIGH (ref 0.61–1.24)
Glucose, Bld: 86 mg/dL (ref 70–99)
HCT: 40 % (ref 39.0–52.0)
Hemoglobin: 13.6 g/dL (ref 13.0–17.0)
Potassium: 5 mmol/L (ref 3.5–5.1)
Sodium: 143 mmol/L (ref 135–145)
TCO2: 23 mmol/L (ref 22–32)

## 2020-06-10 MED ORDER — SODIUM ZIRCONIUM CYCLOSILICATE 10 G PO PACK
10.0000 g | PACK | Freq: Once | ORAL | Status: AC
  Administered 2020-06-10: 10 g via ORAL
  Filled 2020-06-10: qty 1

## 2020-06-10 MED ORDER — FUROSEMIDE 40 MG PO TABS: 40.0000 mg | ORAL_TABLET | Freq: Every day | ORAL | 0 refills | Status: DC

## 2020-06-10 MED ORDER — FUROSEMIDE 10 MG/ML IJ SOLN
40.0000 mg | Freq: Once | INTRAMUSCULAR | Status: AC
  Administered 2020-06-10: 40 mg via INTRAVENOUS
  Filled 2020-06-10: qty 4

## 2020-06-10 NOTE — ED Provider Notes (Signed)
[facility] EMERGENCY DEPARTMENT Provider Note   CSN: [id number] Arrival date & time: 06/09/20  1805     History Chief Complaint  Patient presents with  . Abnormal Lab    Anthony Owens is a 64 y.o. male.  The history is provided by the patient.  Abnormal Lab Resulting agency:  External Result type: chemistry   Chemistry:    Potassium:  High   Creatinine:  High  patient with history of hypertension and diabetes presents with abnormal labs.  Patient reports he underwent a physical exam and labs approximately 2 days ago.  He was called by his provider and was informed that his potassium and kidney function were elevated.  He reports he is unaware if he has any known kidney disease.  He denies any other complaints.  He denies fevers or vomiting.  No headache.  No weight change.  No chest pain or abdominal pain.  He reports normal urine output    Past Medical History:  Diagnosis Date  . Anemia 11/2010   duodenal ulcer, s/p transfusion  . Asthma   . Back pain    resolved  . Diabetes mellitus   . Dyslipidemia   . Erectile dysfunction   . H. pylori infection 03/2012   seen on EGD with gastritis, duodenitis and duodenal ulcer  . Hiatal hernia 03/2012  . Hypertension   . Orbital fracture ([place]) 11/2010   left (due to fall)  . PUD (peptic ulcer disease)    11/2010; duodenal ulcer    Patient Active Problem List   Diagnosis Date Noted  . Obesity (BMI 30-39.9) 01/08/2013  . Erectile dysfunction 01/08/2013  . Type II or unspecified type diabetes mellitus without mention of complication, not stated as uncontrolled 01/08/2013  . Duodenal ulcer due to Helicobacter pylori 08/65/7846  . 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 ANEMIA, IRON DEFICIENCY 03/11/2008  . ESSENTIAL HYPERTENSION 01/21/2008  . ASTHMA 01/21/2008  . PROTEINURIA 01/21/2008  . DYSLIPIDEMIA 03/26/2007    Past Surgical History:  Procedure  Laterality Date  . ESOPHAGOGASTRODUODENOSCOPY  04/01/12       Family History  Problem Relation Age of Onset  . Heart disease Mother   . Alzheimer's disease Mother   . Heart disease Father 24       MI  . Diabetes Sister   . Diabetes Sister   . Cancer Maternal Grandmother        male cancer  . Colon cancer Neg Hx   . Esophageal cancer Neg Hx   . Rectal cancer Neg Hx   . Stomach cancer Neg Hx   . Stroke Neg Hx     Social History   Tobacco Use  . Smoking status: Never Smoker  . Smokeless tobacco: Never Used  Substance Use Topics  . Alcohol use: No  . Drug use: No    Comment: h/o marijuana use.  denies other drug use    Home Medications Prior to Admission medications   Medication Sig Start Date End Date Taking? Authorizing Provider  albuterol (PROVENTIL) (2.5 MG/3ML) 0.083% nebulizer solution Take 3 mLs (2.5 mg total) by nebulization every 4 (four) hours as needed for wheezing or shortness of breath. 03/16/16   Palumbo, April, MD  lisinopril-hydrochlorothiazide (PRINZIDE,ZESTORETIC) 20-25 MG per tablet Take 1 tablet by mouth daily. 02/17/15   Waldemar Dickens, MD  pioglitazone (ACTOS) 45 MG tablet TAKE 1 TABLET BY MOUTH EVERY DAY 02/17/15   Waldemar Dickens, MD  predniSONE (DELTASONE) 20 MG tablet 3 tabs po day one, then 2 po daily x 4 days 03/16/16   Randal Buba, April, MD    Allergies    Patient has no known allergies.  Review of Systems   Review of Systems  Constitutional: Negative for fatigue, fever and unexpected weight change.  Cardiovascular: Negative for chest pain.  Neurological: Negative for weakness and headaches.  All other systems reviewed and are negative.   Physical Exam Updated Vital Signs BP (!) 157/92   Pulse 78   Temp 98 F (36.7 C) (Oral)   Resp 16   Ht 1.803 m (5\' 11" )   Wt 117.9 kg   SpO2 98%   BMI 36.25 kg/m   Physical Exam CONSTITUTIONAL: Well developed/well nourished HEAD: Normocephalic/atraumatic EYES: EOMI ENMT: Mask in  place NECK: supple no meningeal signs SPINE/BACK:entire spine nontender CV: S1/S2 noted, no murmurs/rubs/gallops noted LUNGS: Lungs are clear to auscultation bilaterally, no apparent distress ABDOMEN: soft, nontender, no rebound or guarding, bowel sounds noted throughout abdomen, obese GU:no cva tenderness NEURO: Pt is awake/alert/appropriate, moves all extremitiesx4.  No facial droop.   EXTREMITIES: pulses normal/equal, full ROM SKIN: warm, color normal PSYCH: no abnormalities of mood noted, alert and oriented to situation  ED Results / Procedures / Treatments   Labs (all labs ordered are listed, but only abnormal results are displayed) Labs Reviewed  COMPREHENSIVE METABOLIC PANEL - Abnormal; Notable for the following components:      Result Value   Potassium 5.4 (*)    BUN 54 (*)    Creatinine, Ser 4.30 (*)    Calcium 8.0 (*)    Total Protein 5.7 (*)    Albumin 2.8 (*)    GFR, Estimated 15 (*)    All other components within normal limits  CBC - Abnormal; Notable for the following components:   RBC 3.92 (*)    Hemoglobin 11.4 (*)    HCT 38.5 (*)    MCHC 29.6 (*)    All other components within normal limits  I-STAT CHEM 8, ED - Abnormal; Notable for the following components:   Chloride 112 (*)    BUN 51 (*)    Creatinine, Ser 4.40 (*)    All other components within normal limits    EKG None  Radiology No results found.  Procedures Procedures   Medications Ordered in ED Medications  furosemide (LASIX) injection 40 mg (40 mg Intravenous Given 06/10/20 0445)  sodium zirconium cyclosilicate (LOKELMA) packet 10 g (10 g Oral Given 06/10/20 0446)    ED Course  I have reviewed the triage vital signs and the nursing notes.  Pertinent labs  results that were available during my care of the patient were reviewed by me and considered in my medical decision making (see chart for details).    MDM Rules/Calculators/A&P                          4:39 AM patient presents  for abnormal labs.  He reports he feels fine.  He is hesitant to be admitted.  He agrees to be treated with medications and reassess but if his potassium is still elevated he would need to be admitted 7:13 AM Minimal change of potassium level.  Patient did have appropriate urine output with Lasix Advised admission due to his hyperkalemia. Patient refuses admission.  He reports that he needs to work and cannot afford to be admitted.  Patient also reports he is nearly homeless and  cannot afford to miss any time at work  He understands the risk of leaving which includes death. He will attempt to follow-up next week.  I have consulted our transition of care team to assist with close follow-up so he can have a repeat examination as well as repeat metabolic panel by next week prior to Xmas Patient had good response to IV Lasix.  He was given [facility] as well. At time of discharge I will give him a 5-day course of Lasix. We discussed return precautions including weakness, dizziness, syncope or any decreased urine output. Final Clinical Impression(s) / ED Diagnoses Final diagnoses:  AKI (acute kidney injury) ([place])  Hyperkalemia    Rx / DC Orders ED Discharge Orders         Ordered    furosemide (LASIX) 40 MG tablet  Daily        06/10/20 7215           Ripley Fraise, MD 06/10/20 0715

## 2020-06-10 NOTE — ED Notes (Signed)
Patient verbalized understanding of dc instructions, vss, ambulatory with nad.   

## 2020-06-10 NOTE — ED Notes (Signed)
ED Provider at bedside. 

## 2020-06-10 NOTE — ED Notes (Signed)
Patient very upset that he had to wait so long to come back to a room. Pt initially refused to sit on stretcher and be hooked up by EDT from lobby. Pt now agreeable to be hooked up to monitoring equipment, staff apologized for patient's wait.

## 2020-07-14 ENCOUNTER — Telehealth: Payer: Self-pay | Admitting: Hematology and Oncology

## 2020-07-14 NOTE — Telephone Encounter (Signed)
Received a new hem referral from Dr. 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 Anthony Owens has been cld and scheduled to see Dr. Chryl Heck on 2/8 at 10am. Pt aware to arrive 20 minutes early.

## 2020-07-16 ENCOUNTER — Telehealth: Payer: Self-pay | Admitting: Hematology and Oncology

## 2020-07-16 NOTE — Telephone Encounter (Signed)
Rescheduled appointment per provider PAL schedule. Spoke to patient who is aware of updated appointment date and time.  

## 2020-08-03 ENCOUNTER — Other Ambulatory Visit: Payer: Managed Care, Other (non HMO)

## 2020-08-03 ENCOUNTER — Encounter: Payer: Managed Care, Other (non HMO) | Admitting: Hematology and Oncology

## 2020-08-10 ENCOUNTER — Inpatient Hospital Stay: Payer: Managed Care, Other (non HMO)

## 2020-08-10 ENCOUNTER — Inpatient Hospital Stay: Payer: Managed Care, Other (non HMO) | Attending: Hematology and Oncology | Admitting: Hematology and Oncology

## 2020-10-25 ENCOUNTER — Telehealth: Payer: Self-pay | Admitting: Physician Assistant

## 2020-10-25 NOTE — Telephone Encounter (Signed)
I received a call from [place] at [facility] to schedule Mr. Mindel missed appt from February. Mr. Nannini has been rescheduled to see Murray Hodgkins on 5/9 at 11am. Mechele Claude will provide the appt date and time to the pt.

## 2020-10-29 ENCOUNTER — Other Ambulatory Visit: Payer: Self-pay

## 2020-10-29 DIAGNOSIS — N179 Acute kidney failure, unspecified: Secondary | ICD-10-CM

## 2020-11-01 ENCOUNTER — Inpatient Hospital Stay: Payer: Managed Care, Other (non HMO) | Admitting: Hematology and Oncology

## 2020-11-01 ENCOUNTER — Telehealth: Payer: Self-pay

## 2020-11-01 ENCOUNTER — Other Ambulatory Visit: Payer: Self-pay

## 2020-11-01 ENCOUNTER — Encounter: Payer: Self-pay | Admitting: Physician Assistant

## 2020-11-01 ENCOUNTER — Inpatient Hospital Stay: Payer: Managed Care, Other (non HMO) | Attending: Hematology and Oncology | Admitting: Physician Assistant

## 2020-11-01 VITALS — BP 136/82 | HR 71 | Temp 97.0°F | Resp 18 | Ht 71.0 in | Wt 228.3 lb

## 2020-11-01 DIAGNOSIS — I129 Hypertensive chronic kidney disease with stage 1 through stage 4 chronic kidney disease, or unspecified chronic kidney disease: Secondary | ICD-10-CM | POA: Insufficient documentation

## 2020-11-01 DIAGNOSIS — D472 Monoclonal gammopathy: Secondary | ICD-10-CM

## 2020-11-01 DIAGNOSIS — J45909 Unspecified asthma, uncomplicated: Secondary | ICD-10-CM | POA: Insufficient documentation

## 2020-11-01 DIAGNOSIS — E785 Hyperlipidemia, unspecified: Secondary | ICD-10-CM | POA: Insufficient documentation

## 2020-11-01 DIAGNOSIS — E1122 Type 2 diabetes mellitus with diabetic chronic kidney disease: Secondary | ICD-10-CM | POA: Diagnosis not present

## 2020-11-01 DIAGNOSIS — N184 Chronic kidney disease, stage 4 (severe): Secondary | ICD-10-CM | POA: Insufficient documentation

## 2020-11-01 DIAGNOSIS — E669 Obesity, unspecified: Secondary | ICD-10-CM | POA: Diagnosis not present

## 2020-11-01 LAB — CMP (CANCER CENTER ONLY)
ALT: 23 U/L (ref 0–44)
AST: 17 U/L (ref 15–41)
Albumin: 3.6 g/dL (ref 3.5–5.0)
Alkaline Phosphatase: 97 U/L (ref 38–126)
Anion gap: 11 (ref 5–15)
BUN: 73 mg/dL — ABNORMAL HIGH (ref 8–23)
CO2: 20 mmol/L — ABNORMAL LOW (ref 22–32)
Calcium: 8.5 mg/dL — ABNORMAL LOW (ref 8.9–10.3)
Chloride: 115 mmol/L — ABNORMAL HIGH (ref 98–111)
Creatinine: 5.79 mg/dL (ref 0.61–1.24)
GFR, Estimated: 10 mL/min — ABNORMAL LOW (ref >60)
Glucose, Bld: 84 mg/dL (ref 70–99)
Potassium: 5.5 mmol/L — ABNORMAL HIGH (ref 3.5–5.1)
Sodium: 146 mmol/L — ABNORMAL HIGH (ref 135–145)
Total Bilirubin: 0.4 mg/dL (ref 0.3–1.2)
Total Protein: 7 g/dL (ref 6.5–8.1)

## 2020-11-01 LAB — CBC WITH DIFFERENTIAL (CANCER CENTER ONLY)
Abs Immature Granulocytes: 0.01 10*3/uL (ref 0.00–0.07)
Basophils Absolute: 0 10*3/uL (ref 0.0–0.1)
Basophils Relative: 1 %
Eosinophils Absolute: 0.5 10*3/uL (ref 0.0–0.5)
Eosinophils Relative: 11 %
HCT: 36.9 % — ABNORMAL LOW (ref 39.0–52.0)
Hemoglobin: 11.7 g/dL — ABNORMAL LOW (ref 13.0–17.0)
Immature Granulocytes: 0 %
Lymphocytes Relative: 26 %
Lymphs Abs: 1.2 10*3/uL (ref 0.7–4.0)
MCH: 29.7 pg (ref 26.0–34.0)
MCHC: 31.7 g/dL (ref 30.0–36.0)
MCV: 93.7 fL (ref 80.0–100.0)
Monocytes Absolute: 0.4 10*3/uL (ref 0.1–1.0)
Monocytes Relative: 10 %
Neutro Abs: 2.5 10*3/uL (ref 1.7–7.7)
Neutrophils Relative %: 52 %
Platelet Count: 184 10*3/uL (ref 150–400)
RBC: 3.94 MIL/uL — ABNORMAL LOW (ref 4.22–5.81)
RDW: 13.6 % (ref 11.5–15.5)
WBC Count: 4.6 10*3/uL (ref 4.0–10.5)
nRBC: 0 % (ref 0.0–0.2)

## 2020-11-01 LAB — LACTATE DEHYDROGENASE: LDH: 243 U/L — ABNORMAL HIGH (ref 98–192)

## 2020-11-01 NOTE — Progress Notes (Addendum)
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 Mare Ferrari 65 y.o. male with medical history significant for CKD Stage 4, hypertension, type 2 DM, obesity, hyperlipidemia and asthma. Patient is unaccompanied for this visit.   On exam today, he reports that he is doing well without any significant limitations. His energy and appetite levels are stable. He continues to complete all his daily activities on his own. He denies any nausea, vomiting or abdominal pain. His bowel movements are regular without any diarrhea or constipation. Patient denies any easy bruising or signs of bleeding. He denies any fevers, chills, shortness of breath, chest pain, cough, edema, neuropathy, urinary symptoms, muscle/bone pain or skin changes. He has no other complaints. Rest of the 10 point ROS is below.   MEDICAL HISTORY:  Past Medical History:  Diagnosis Date  . Anemia 11/2010   duodenal ulcer, s/p transfusion  . Asthma   . Back pain    resolved  . Diabetes mellitus   . Dyslipidemia   . Erectile dysfunction   . H. pylori infection 03/2012   seen on EGD with gastritis, duodenitis and duodenal ulcer  . Hiatal hernia 03/2012  . Hypertension   . Orbital fracture ([place]) 11/2010   left (due to fall)  . PUD (peptic ulcer disease)    11/2010; duodenal ulcer    SURGICAL HISTORY: Past Surgical History:  Procedure Laterality Date  . ESOPHAGOGASTRODUODENOSCOPY  04/01/12    SOCIAL HISTORY: Social History   Socioeconomic History  . Marital status: Single    Spouse name: Not on file  . Number of children: 0  . Years of education: Not on file  . Highest education  level: Not on file  Occupational History  . Occupation: Airline pilot: [employer]  Tobacco Use  . Smoking status: Never Smoker  . Smokeless tobacco: Never Used  Substance and Sexual Activity  . Alcohol use: Yes    Comment: occasionally  . Drug use: No    Comment: h/o marijuana use.  denies other drug use  . Sexual activity: Yes    Partners: Female  Other Topics Concern  . Not on file  Social History Narrative   Lives with his sister. Passive tobacco exposure from his sister.  Cook at [employer]   Social Determinants of Health   Financial Resource Strain: Not on file  Food Insecurity: Not on file  Transportation Needs: Not on file  Physical Activity: Not on file  Stress: Not on file  Social Connections: Not on file  Intimate Partner Violence: Not on file    FAMILY HISTORY: Family History  Problem Relation Age of Onset  . Heart disease Mother   . Alzheimer's disease Mother   . Heart disease Father 20       MI  . Diabetes Sister   . Diabetes Sister   . Cancer Maternal Grandmother        male cancer  . Colon cancer Neg Hx   . Esophageal cancer Neg Hx   . Rectal cancer Neg Hx   . Stomach cancer Neg Hx   . Stroke Neg Hx     ALLERGIES:  has No Known Allergies.  MEDICATIONS:  Current  Outpatient Medications  Medication Sig Dispense Refill  . albuterol (PROVENTIL) (2.5 MG/3ML) 0.083% nebulizer solution Take 3 mLs (2.5 mg total) by nebulization every 4 (four) hours as needed for wheezing or shortness of breath. 30 vial 0  . furosemide (LASIX) 40 MG tablet Take 1 tablet (40 mg total) by mouth daily. 5 tablet 0  . lisinopril-hydrochlorothiazide (PRINZIDE,ZESTORETIC) 20-25 MG per tablet Take 1 tablet by mouth daily. 30 tablet 0  . pioglitazone (ACTOS) 45 MG tablet TAKE 1 TABLET BY MOUTH EVERY DAY 30 tablet 0  . predniSONE (DELTASONE) 20 MG tablet 3 tabs po day one, then 2 po daily x 4 days 11 tablet 0   No current facility-administered  medications for this visit.    REVIEW OF SYSTEMS:   Constitutional: ( - ) fevers, ( - )  chills , ( - ) night sweats Eyes: ( - ) blurriness of vision, ( - ) double vision, ( - ) watery eyes Ears, nose, mouth, throat, and face: ( - ) mucositis, ( - ) sore throat Respiratory: ( - ) cough, ( - ) dyspnea, ( - ) wheezes Cardiovascular: ( - ) palpitation, ( - ) chest discomfort, ( - ) lower extremity swelling Gastrointestinal:  ( - ) nausea, ( - ) heartburn, ( - ) change in bowel habits Skin: ( - ) abnormal skin rashes Lymphatics: ( - ) new lymphadenopathy, ( - ) easy bruising Neurological: ( - ) numbness, ( - ) tingling, ( - ) new weaknesses Behavioral/Psych: ( - ) mood change, ( - ) new changes  All other systems were reviewed with the patient and are negative.  PHYSICAL EXAMINATION: ECOG PERFORMANCE STATUS: 0 - Asymptomatic  Vitals:   11/01/20 1126  BP: 136/82  Pulse: 71  Resp: 18  Temp: (!) 97 F (36.1 C)  SpO2: 100%   Filed Weights   11/01/20 1126  Weight: 228 lb 4.8 oz (103.6 kg)    GENERAL: well appearing African American male in NAD  SKIN: skin color, texture, turgor are normal, no rashes or significant lesions EYES: conjunctiva are pink and non-injected, sclera clear OROPHARYNX: no exudate, no erythema; lips, buccal mucosa, and tongue normal  NECK: supple, non-tender LYMPH:  no palpable lymphadenopathy in the cervical, axillary or supraclavicular lymph nodes.  LUNGS: clear to auscultation and percussion with normal breathing effort HEART: regular rate & rhythm and no murmurs and no lower extremity edema ABDOMEN: soft, non-tender, non-distended, normal bowel sounds Musculoskeletal: no cyanosis of digits and no clubbing  PSYCH: alert & oriented x 3, fluent speech NEURO: no focal motor/sensory deficits  LABORATORY DATA:  I have reviewed the data as listed CBC Latest Ref Rng & Units 11/01/2020 06/10/2020 06/09/2020  WBC 4.0 - 10.5 K/uL 4.6 - 5.3  Hemoglobin 13.0 - 17.0  g/dL 11.7(L) 13.6 11.4(L)  Hematocrit 39.0 - 52.0 % 36.9(L) 40.0 38.5(L)  Platelets 150 - 400 K/uL 184 - 183    CMP Latest Ref Rng & Units 11/01/2020 06/10/2020 06/09/2020  Glucose 70 - 99 mg/dL 84 86 78  BUN 8 - 23 mg/dL 73(H) 51(H) 54(H)  Creatinine 0.61 - 1.24 mg/dL 5.79(HH) 4.40(H) 4.30(H)  Sodium 135 - 145 mmol/L 146(H) 143 141  Potassium 3.5 - 5.1 mmol/L 5.5(H) 5.0 5.4(H)  Chloride 98 - 111 mmol/L 115(H) 112(H) 111  CO2 22 - 32 mmol/L 20(L) - 23  Calcium 8.9 - 10.3 mg/dL 8.5(L) - 8.0(L)  Total Protein 6.5 - 8.1 g/dL 7.0 - 5.7(L)  Total Bilirubin 0.3 -  1.2 mg/dL 0.4 - 0.6  Alkaline Phos 38 - 126 U/L 97 - 80  AST 15 - 41 U/L 17 - 23  ALT 0 - 44 U/L 23 - 32    ASSESSMENT & PLAN Jeffery A. Luse is a 65 y.o. male presenting to the clinic for evaluation for monoclonal gammopathy. Patient was referred by Dr. Harrie Jeans from [facility] but outside labs are unavailable for review. Recommend full MGUS workup with CBC, CMP, multiple myeloma panel, kappa/lambda light chain, UPEP, beta 2 microglobulin and LDH levels. Bone Survey will be obtained to evaluate for lytic lesions. Plan to follow up with results by phone. If no additional intervention is required, we will plan to see patient back in clinic in 3 months.    #MGUS: --Recommend further workup with CBC, CMP, multiple myeloma panel, kappa/lambda light chain, UPEP,beta 2 microglobulin and LDH levels.  --Need to obtain DG bone met surgery to evaluate for lytic lesions.  --Will consider bone marrow biopsy based on above workup --RTC in 3 months with labs unless further intervention is required.   **ADDENDUM: Received critical lab with creatinine of 5.79 in the setting of CKD Stage 4. I reached out to Dr. Harrie Jeans from [facility] and discussed labs results. Creatinine and potassium levels have increased from prior per Dr. Luis Abed review. She will plan to see patient back in clinic to further discuss  recommendations.   Orders Placed This Encounter  Procedures  . DG Bone Survey Met    Standing Status:   Future    Standing Expiration Date:   11/01/2021    Order Specific Question:   Reason for Exam (SYMPTOM  OR DIAGNOSIS REQUIRED)    Answer:   evaluate for lytic lesions    Order Specific Question:   Preferred imaging location?    Answer:   [facility]  . CBC with Differential ([place] Only)    Standing Status:   Future    Number of Occurrences:   1    Standing Expiration Date:   11/01/2021  . CMP ([place] only)    Standing Status:   Future    Number of Occurrences:   1    Standing Expiration Date:   11/01/2021  . Multiple Myeloma Panel (SPEP&IFE w/QIG)    Standing Status:   Future    Number of Occurrences:   1    Standing Expiration Date:   11/01/2021  . Kappa/lambda light chains    Standing Status:   Future    Standing Expiration Date:   11/01/2021  . 24-Hr Ur UPEP/UIFE/Light Chains/TP    Standing Status:   Future    Standing Expiration Date:   11/01/2021  . Beta 2 microglobulin    Standing Status:   Future    Number of Occurrences:   1    Standing Expiration Date:   11/01/2021  . Lactate dehydrogenase (LDH)    Standing Status:   Future    Number of Occurrences:   1    Standing Expiration Date:   11/01/2021    All questions were answered. The patient knows to call the clinic with any problems, questions or concerns.  A total of more than 60 minutes were spent on this encounter and over half of that time was spent on counseling and coordination of care as outlined above.    Dede Query, PA-C Department of Hematology/Oncology [place] at [facility] Phone: [phone]  Patient was seen  with Dr. Lorenso Courier.   I have read the above note and personally examined the patient. I agree with the assessment and plan as noted above.  Briefly Mr. Kelnhofer is a 65 year old male with medical history significant for CKD who presents for evaluation of  a monoclonal gammopathy.  Unfortunately do not have baseline labs from outside records so we will conduct a full MGUS work-up from scratch to include UPEP and metastatic survey.  In the event the patient is found to be a high risk MGUS we will pursue a bone marrow biopsy.   Ledell Peoples, MD Department of Hematology/Oncology [place] at [facility] Phone: [phone] Pager: [phone] Email: [email or web address]

## 2020-11-01 NOTE — Telephone Encounter (Signed)
CRITICAL VALUE STICKER  CRITICAL VALUE: Creatinine - 5.79  RECEIVER (on-site recipient of call): Patty Sermons, RN  [place] NOTIFIED: 11/01/2020 @ 1409   MESSENGER (representative from lab): Ulice Dash  MD NOTIFIED: Dede Query, PA-C  TIME OF NOTIFICATION: 11/01/2020 @ 1413  RESPONSE: Provider aware - no new orders

## 2020-11-02 LAB — BETA 2 MICROGLOBULIN, SERUM: Beta-2 Microglobulin: 6.6 mg/L — ABNORMAL HIGH (ref 0.6–2.4)

## 2020-11-03 ENCOUNTER — Telehealth: Payer: Self-pay | Admitting: Physician Assistant

## 2020-11-03 NOTE — Telephone Encounter (Signed)
Scheduled per los. Called and spoke with patient. Confirmed appts  

## 2020-11-04 LAB — MULTIPLE MYELOMA PANEL, SERUM
Albumin SerPl Elph-Mcnc: 3.6 g/dL (ref 2.9–4.4)
Albumin/Glob SerPl: 1.3 (ref 0.7–1.7)
Alpha 1: 0.2 g/dL (ref 0.0–0.4)
Alpha2 Glob SerPl Elph-Mcnc: 0.9 g/dL (ref 0.4–1.0)
B-Globulin SerPl Elph-Mcnc: 1 g/dL (ref 0.7–1.3)
Gamma Glob SerPl Elph-Mcnc: 0.9 g/dL (ref 0.4–1.8)
Globulin, Total: 3 g/dL (ref 2.2–3.9)
IgA: 218 mg/dL (ref 61–437)
IgG (Immunoglobin G), Serum: 1093 mg/dL (ref 603–1613)
IgM (Immunoglobulin M), Srm: 33 mg/dL (ref 20–172)
M Protein SerPl Elph-Mcnc: 0.2 g/dL — ABNORMAL HIGH
Total Protein ELP: 6.6 g/dL (ref 6.0–8.5)

## 2020-11-08 ENCOUNTER — Ambulatory Visit (HOSPITAL_COMMUNITY)
Admission: RE | Admit: 2020-11-08 | Discharge: 2020-11-08 | Disposition: A | Discharge status: Home / Self Care | Payer: Managed Care, Other (non HMO) | Source: Ambulatory Visit | Attending: Physician Assistant | Admitting: Physician Assistant

## 2020-11-08 ENCOUNTER — Other Ambulatory Visit: Payer: Self-pay

## 2020-11-08 DIAGNOSIS — D472 Monoclonal gammopathy: Secondary | ICD-10-CM | POA: Diagnosis not present

## 2020-11-11 ENCOUNTER — Telehealth: Payer: Self-pay | Admitting: Physician Assistant

## 2020-11-11 NOTE — Telephone Encounter (Signed)
I called [name] to review the lab results from 11/01/2020. There is evidence of m-protein observed at 0.2 g/dL. 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 Patient's creatinine level has continued to rise, now 5.79 and potassium level is 5.5. Bone met survey was unremarkable for lytic lesions.   Serum free light chains and UPEP are not complete. I arranged for lab appt on 11/15/2020 to collect remaining labs. Patient reports that Dr. Luis Abed team contacted him and he is currently taking medicine to help lower the potassium level.  Once we review the remaining labs, it will be determined if further workup is required including a bone marrow biopsy. If not, patient is scheduled to return in 3 months with repeat labs.    Patient expressed understanding of the plan provided.

## 2020-11-15 ENCOUNTER — Inpatient Hospital Stay: Payer: Managed Care, Other (non HMO)

## 2020-11-15 DIAGNOSIS — D472 Monoclonal gammopathy: Secondary | ICD-10-CM | POA: Diagnosis not present

## 2020-11-16 LAB — UPEP/UIFE/LIGHT CHAINS/TP, 24-HR UR
% BETA, Urine: 8 %
ALPHA 1 URINE: 2.3 %
Albumin, U: 74.7 %
Alpha 2, Urine: 4.5 %
Free Kappa Lt Chains,Ur: 211.01 mg/L — ABNORMAL HIGH (ref 1.17–86.46)
Free Kappa/Lambda Ratio: 8.83 (ref 1.83–14.26)
Free Lambda Lt Chains,Ur: 23.89 mg/L — ABNORMAL HIGH (ref 0.27–15.21)
GAMMA GLOBULIN URINE: 10.6 %
M-SPIKE %, Urine: 2.8 % — ABNORMAL HIGH
M-Spike, Mg/24 Hr: 42 mg/24 hr — ABNORMAL HIGH
Total Protein, Urine-Ur/day: 1482 mg/24 hr — ABNORMAL HIGH (ref 30–150)
Total Protein, Urine: 128.9 mg/dL
Total Volume: 1150

## 2020-11-18 ENCOUNTER — Telehealth: Payer: Self-pay | Admitting: Physician Assistant

## 2020-11-18 NOTE — Telephone Encounter (Signed)
I tried to reach [name] by phone and left a voicemail to call back. Patient needs to come for labs to check serum free light chains to complete workup for MGUS. I scheduled labs on 11/15/2020 but patient did not show. Need to schedule labs for tomorrow, 11/19/2020, or next week.

## 2020-11-23 ENCOUNTER — Encounter: Payer: Managed Care, Other (non HMO) | Admitting: Vascular Surgery

## 2020-11-23 ENCOUNTER — Ambulatory Visit (HOSPITAL_COMMUNITY): Payer: Managed Care, Other (non HMO) | Attending: Vascular Surgery

## 2020-11-23 ENCOUNTER — Ambulatory Visit (HOSPITAL_COMMUNITY): Payer: Managed Care, Other (non HMO)

## 2020-11-26 ENCOUNTER — Inpatient Hospital Stay: Payer: Managed Care, Other (non HMO) | Attending: Hematology and Oncology

## 2020-11-26 ENCOUNTER — Other Ambulatory Visit: Payer: Self-pay

## 2020-11-26 DIAGNOSIS — E785 Hyperlipidemia, unspecified: Secondary | ICD-10-CM | POA: Insufficient documentation

## 2020-11-26 DIAGNOSIS — E1122 Type 2 diabetes mellitus with diabetic chronic kidney disease: Secondary | ICD-10-CM | POA: Diagnosis not present

## 2020-11-26 DIAGNOSIS — D472 Monoclonal gammopathy: Secondary | ICD-10-CM | POA: Insufficient documentation

## 2020-11-26 DIAGNOSIS — N184 Chronic kidney disease, stage 4 (severe): Secondary | ICD-10-CM | POA: Diagnosis not present

## 2020-11-26 DIAGNOSIS — I129 Hypertensive chronic kidney disease with stage 1 through stage 4 chronic kidney disease, or unspecified chronic kidney disease: Secondary | ICD-10-CM | POA: Insufficient documentation

## 2020-11-26 DIAGNOSIS — J45909 Unspecified asthma, uncomplicated: Secondary | ICD-10-CM | POA: Diagnosis not present

## 2020-11-26 DIAGNOSIS — E669 Obesity, unspecified: Secondary | ICD-10-CM | POA: Insufficient documentation

## 2020-11-29 LAB — KAPPA/LAMBDA LIGHT CHAINS
Kappa free light chain: 155.9 mg/L — ABNORMAL HIGH (ref 3.3–19.4)
Kappa, lambda light chain ratio: 3.27 — ABNORMAL HIGH (ref 0.26–1.65)
Lambda free light chains: 47.7 mg/L — ABNORMAL HIGH (ref 5.7–26.3)

## 2020-11-30 ENCOUNTER — Encounter: Payer: Self-pay | Admitting: Vascular Surgery

## 2020-11-30 ENCOUNTER — Other Ambulatory Visit: Payer: Self-pay

## 2020-11-30 ENCOUNTER — Ambulatory Visit (INDEPENDENT_AMBULATORY_CARE_PROVIDER_SITE_OTHER): Payer: Managed Care, Other (non HMO) | Admitting: Vascular Surgery

## 2020-11-30 ENCOUNTER — Telehealth: Payer: Self-pay | Admitting: Physician Assistant

## 2020-11-30 ENCOUNTER — Other Ambulatory Visit (HOSPITAL_COMMUNITY): Payer: Self-pay | Admitting: Vascular Surgery

## 2020-11-30 ENCOUNTER — Ambulatory Visit (HOSPITAL_COMMUNITY)
Admission: RE | Admit: 2020-11-30 | Discharge: 2020-11-30 | Disposition: A | Discharge status: Home / Self Care | Payer: Managed Care, Other (non HMO) | Source: Ambulatory Visit | Attending: Vascular Surgery | Admitting: Vascular Surgery

## 2020-11-30 ENCOUNTER — Other Ambulatory Visit: Payer: Self-pay | Admitting: *Deleted

## 2020-11-30 ENCOUNTER — Ambulatory Visit (INDEPENDENT_AMBULATORY_CARE_PROVIDER_SITE_OTHER)
Admission: RE | Admit: 2020-11-30 | Discharge: 2020-11-30 | Disposition: A | Discharge status: Home / Self Care | Payer: Managed Care, Other (non HMO) | Source: Ambulatory Visit | Attending: Vascular Surgery | Admitting: Vascular Surgery

## 2020-11-30 DIAGNOSIS — N185 Chronic kidney disease, stage 5: Secondary | ICD-10-CM | POA: Diagnosis not present

## 2020-11-30 DIAGNOSIS — N179 Acute kidney failure, unspecified: Secondary | ICD-10-CM

## 2020-11-30 NOTE — Progress Notes (Signed)
Agree with 3 month follow up

## 2020-11-30 NOTE — Progress Notes (Signed)
Patient name: Malacai Grantz. Laski MRN: [id number] DOB: 06/20/1956 Sex: male  REASON FOR CONSULT: Evaluate for permanent dialysis access  HPI: Dareon A. Carley is a 65 y.o. male, with history of hypertension and stage V CKD that presents for evaluation of permanent dialysis access.  Patient states he is right-handed.  He works as a Training and development officer at [employer].  He's had no previous access in the past.  No chest wall implants.  States he would really like to try and put this off as long as possible.  Consult from nephrology states place AVF now and wait on AVG.  Past Medical History:  Diagnosis Date  . Anemia 11/2010   duodenal ulcer, s/p transfusion  . Asthma   . Back pain    resolved  . Diabetes mellitus   . Dyslipidemia   . Erectile dysfunction   . H. pylori infection 03/2012   seen on EGD with gastritis, duodenitis and duodenal ulcer  . Hiatal hernia 03/2012  . Hypertension   . Orbital fracture ([place]) 11/2010   left (due to fall)  . PUD (peptic ulcer disease)    11/2010; duodenal ulcer    Past Surgical History:  Procedure Laterality Date  . ESOPHAGOGASTRODUODENOSCOPY  04/01/12    Family History  Problem Relation Age of Onset  . Heart disease Mother   . Alzheimer's disease Mother   . Heart disease Father 49       MI  . Diabetes Sister   . Diabetes Sister   . Cancer Maternal Grandmother        male cancer  . Colon cancer Neg Hx   . Esophageal cancer Neg Hx   . Rectal cancer Neg Hx   . Stomach cancer Neg Hx   . Stroke Neg Hx     SOCIAL HISTORY: Social History   Socioeconomic History  . Marital status: Single    Spouse name: Not on file  . Number of children: 0  . Years of education: Not on file  . Highest education level: Not on file  Occupational History  . Occupation: Airline pilot: [employer]  Tobacco Use  . Smoking status: Never Smoker  . Smokeless tobacco: Never Used  Substance and Sexual Activity  . Alcohol use: Yes    Comment:  occasionally  . Drug use: No    Comment: h/o marijuana use.  denies other drug use  . Sexual activity: Yes    Partners: Female  Other Topics Concern  . Not on file  Social History Narrative   Lives with his sister. Passive tobacco exposure from his sister.  Cook at [employer]   Social Determinants of Health   Financial Resource Strain: Not on file  Food Insecurity: Not on file  Transportation Needs: Not on file  Physical Activity: Not on file  Stress: Not on file  Social Connections: Not on file  Intimate Partner Violence: Not on file    No Known Allergies  Current Outpatient Medications  Medication Sig Dispense Refill  . furosemide (LASIX) 40 MG tablet Take 1 tablet (40 mg total) by mouth daily. 5 tablet 0  . lisinopril-hydrochlorothiazide (PRINZIDE,ZESTORETIC) 20-25 MG per tablet Take 1 tablet by mouth daily. 30 tablet 0  . pioglitazone (ACTOS) 45 MG tablet TAKE 1 TABLET BY MOUTH EVERY DAY 30 tablet 0  . albuterol (PROVENTIL) (2.5 MG/3ML) 0.083% nebulizer solution Take 3 mLs (2.5 mg total) by nebulization every 4 (four) hours as needed for wheezing  or shortness of breath. (Patient not taking: Reported on 11/30/2020) 30 vial 0  . predniSONE (DELTASONE) 20 MG tablet 3 tabs po day one, then 2 po daily x 4 days (Patient not taking: Reported on 11/30/2020) 11 tablet 0   No current facility-administered medications for this visit.    REVIEW OF SYSTEMS:  [X]  denotes positive finding, [ ]  denotes negative finding Cardiac  Comments:  Chest pain or chest pressure:    Shortness of breath upon exertion:    Short of breath when lying flat:    Irregular heart rhythm:        Vascular    Pain in calf, thigh, or hip brought on by ambulation:    Pain in feet at night that wakes you up from your sleep:     Blood clot in your veins:    Leg swelling:         Pulmonary    Oxygen at home:    Productive cough:     Wheezing:         Neurologic    Sudden weakness in arms or legs:      Sudden numbness in arms or legs:     Sudden onset of difficulty speaking or slurred speech:    Temporary loss of vision in one eye:     Problems with dizziness:         Gastrointestinal    Blood in stool:     Vomited blood:         Genitourinary    Burning when urinating:     Blood in urine:        Psychiatric    Major depression:         Hematologic    Bleeding problems:    Problems with blood clotting too easily:        Skin    Rashes or ulcers:        Constitutional    Fever or chills:      PHYSICAL EXAM: Vitals:   11/30/20 1114  BP: 137/81  Pulse: 62  Resp: 18  Temp: (!) 97.4 F (36.3 C)  TempSrc: Temporal  SpO2: 95%  Weight: 224 lb (101.6 kg)  Height: 5\' 11"  (1.803 m)    GENERAL: The patient is a well-nourished male, in no acute distress. The vital signs are documented above. CARDIAC: There is a regular rate and rhythm.  VASCULAR:  Palpable radial brachial pulses bilateral upper extremities No upper extremity tissue loss PULMONARY: There is good air exchange bilaterally without wheezing or rales. ABDOMEN: Soft and non-tender. MUSCULOSKELETAL: There are no major deformities or cyanosis. NEUROLOGIC: No focal weakness or paresthesias are detected. SKIN: There are no ulcers or rashes noted. PSYCHIATRIC: The patient has a normal affect.  DATA:   Vein mapping shows excellent sized cephalic and basilic veins in the right arm which is his dominant arm.  Left arm has a usable cephalic vein but does have one segment that appears smaller in the distal upper arm.  Assessment/Plan:  65 year old male with stage V CKD and hypertension the presents for permanent dialysis access evaluation.  Discussed plans for normal placement in the nondominant arm which would be his left arm.  He actually has better veins in the right arm, but would like to avoid right arm access given he is still working as a Training and development officer at [employer] which is understandable.  Discussed  that the left cephalic vein looks pretty good except for one segment that looks  smaller in the distal upper arm.   Discussed we would evaluate this with ultrasound in the OR and pass manual dilators if needed.  Discussed risk of bleeding, infection, failure to mature, steal syndrome.  Ultimately he wants to talk to his kidney doctor and will call back to schedule.   Marty Heck, MD [facility] Office: [phone]

## 2020-12-01 NOTE — Telephone Encounter (Signed)
I called 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 I reviewed that there is evidence of M-protein at 0.2. 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 Kappa/Lambda light chain ratio is 3.27 but secondary to CKD Stage 5, UPEP reveals total m-portein in 24 hour is 42. LDH and Beta 2 microglobulin were elevated as well. Bone met survey did not reveal any lytic lesions.   Recommend to monitor for now and repeat labs in 3 months. No indication at this time to proceed with a bone marrow biopsy.

## 2021-01-31 ENCOUNTER — Other Ambulatory Visit: Payer: Self-pay | Admitting: Physician Assistant

## 2021-01-31 DIAGNOSIS — D472 Monoclonal gammopathy: Secondary | ICD-10-CM

## 2021-02-01 ENCOUNTER — Inpatient Hospital Stay: Payer: Managed Care, Other (non HMO)

## 2021-02-01 ENCOUNTER — Inpatient Hospital Stay: Payer: Managed Care, Other (non HMO) | Attending: Hematology and Oncology | Admitting: Physician Assistant

## 2021-02-01 DIAGNOSIS — N185 Chronic kidney disease, stage 5: Secondary | ICD-10-CM | POA: Insufficient documentation

## 2021-02-01 DIAGNOSIS — D472 Monoclonal gammopathy: Secondary | ICD-10-CM | POA: Insufficient documentation

## 2021-02-03 ENCOUNTER — Telehealth: Payer: Self-pay | Admitting: Physician Assistant

## 2021-02-03 NOTE — Telephone Encounter (Signed)
R/s appts per 8/10 sch msg. Called pt, no answer. Left msg with appts date and times.

## 2021-02-22 ENCOUNTER — Telehealth: Payer: Self-pay

## 2021-02-22 ENCOUNTER — Inpatient Hospital Stay: Payer: Managed Care, Other (non HMO) | Admitting: Physician Assistant

## 2021-02-22 ENCOUNTER — Other Ambulatory Visit: Payer: Self-pay

## 2021-02-22 ENCOUNTER — Inpatient Hospital Stay: Payer: Managed Care, Other (non HMO)

## 2021-02-22 VITALS — BP 173/94 | HR 68 | Resp 18 | Ht 71.0 in | Wt 234.4 lb

## 2021-02-22 DIAGNOSIS — D472 Monoclonal gammopathy: Secondary | ICD-10-CM | POA: Diagnosis present

## 2021-02-22 DIAGNOSIS — N185 Chronic kidney disease, stage 5: Secondary | ICD-10-CM | POA: Diagnosis not present

## 2021-02-22 LAB — CBC WITH DIFFERENTIAL (CANCER CENTER ONLY)
Abs Immature Granulocytes: 0.01 10*3/uL (ref 0.00–0.07)
Basophils Absolute: 0 10*3/uL (ref 0.0–0.1)
Basophils Relative: 1 %
Eosinophils Absolute: 0.4 10*3/uL (ref 0.0–0.5)
Eosinophils Relative: 8 %
HCT: 34.2 % — ABNORMAL LOW (ref 39.0–52.0)
Hemoglobin: 10.7 g/dL — ABNORMAL LOW (ref 13.0–17.0)
Immature Granulocytes: 0 %
Lymphocytes Relative: 28 %
Lymphs Abs: 1.3 10*3/uL (ref 0.7–4.0)
MCH: 29.4 pg (ref 26.0–34.0)
MCHC: 31.3 g/dL (ref 30.0–36.0)
MCV: 94 fL (ref 80.0–100.0)
Monocytes Absolute: 0.6 10*3/uL (ref 0.1–1.0)
Monocytes Relative: 12 %
Neutro Abs: 2.4 10*3/uL (ref 1.7–7.7)
Neutrophils Relative %: 51 %
Platelet Count: 161 10*3/uL (ref 150–400)
RBC: 3.64 MIL/uL — ABNORMAL LOW (ref 4.22–5.81)
RDW: 13.9 % (ref 11.5–15.5)
WBC Count: 4.7 10*3/uL (ref 4.0–10.5)
nRBC: 0 % (ref 0.0–0.2)

## 2021-02-22 LAB — CMP (CANCER CENTER ONLY)
ALT: 17 U/L (ref 0–44)
AST: 14 U/L — ABNORMAL LOW (ref 15–41)
Albumin: 3.2 g/dL — ABNORMAL LOW (ref 3.5–5.0)
Alkaline Phosphatase: 86 U/L (ref 38–126)
Anion gap: 8 (ref 5–15)
BUN: 64 mg/dL — ABNORMAL HIGH (ref 8–23)
CO2: 20 mmol/L — ABNORMAL LOW (ref 22–32)
Calcium: 7.7 mg/dL — ABNORMAL LOW (ref 8.9–10.3)
Chloride: 118 mmol/L — ABNORMAL HIGH (ref 98–111)
Creatinine: 5.96 mg/dL (ref 0.61–1.24)
GFR, Estimated: 10 mL/min — ABNORMAL LOW (ref >60)
Glucose, Bld: 93 mg/dL (ref 70–99)
Potassium: 5 mmol/L (ref 3.5–5.1)
Sodium: 146 mmol/L — ABNORMAL HIGH (ref 135–145)
Total Bilirubin: 0.3 mg/dL (ref 0.3–1.2)
Total Protein: 6.6 g/dL (ref 6.5–8.1)

## 2021-02-22 LAB — LACTATE DEHYDROGENASE: LDH: 239 U/L — ABNORMAL HIGH (ref 98–192)

## 2021-02-22 NOTE — Telephone Encounter (Signed)
CRITICAL VALUE STICKER  CRITICAL VALUE: Creatinine = 5.96  RECEIVER (on-site recipient of call): [name], CMA  DATE & TIME NOTIFIED: 02/22/21 at [place] (representative from lab): Hillary  MD NOTIFIED: Dede Query, PA-C  TIME OF NOTIFICATION: 02/22/21 at 63  RESPONSE: Notification given to Anthony Owens, [place] for follow-up with pt.

## 2021-02-23 LAB — KAPPA/LAMBDA LIGHT CHAINS
Kappa free light chain: 176.8 mg/L — ABNORMAL HIGH (ref 3.3–19.4)
Kappa, lambda light chain ratio: 3.35 — ABNORMAL HIGH (ref 0.26–1.65)
Lambda free light chains: 52.7 mg/L — ABNORMAL HIGH (ref 5.7–26.3)

## 2021-02-23 LAB — BETA 2 MICROGLOBULIN, SERUM: Beta-2 Microglobulin: 6.9 mg/L — ABNORMAL HIGH (ref 0.6–2.4)

## 2021-02-23 NOTE — Progress Notes (Signed)
[place] [phone]   [phone]  PROGRESS NOTE  Patient Care Team: Medicine, [facility] as PCP - General (Family Medicine)  CHIEF COMPLAINTS/PURPOSE OF CONSULTATION:  "Monoclonal gammopathy "  HISTORY OF PRESENTING ILLNESS:  Anthony Owens 65 y.o. 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 He is unaccompanied for this visit. Since the last visit, he denies any changes to his health.   On exam today, he reports that his energy and appetite levels are stable. He continues to complete all his daily activities on his own. He denies any nausea, vomiting or abdominal pain. His bowel movements are regular without any diarrhea or constipation. He denies any bone pain. He denies easy bruising or signs of bleeding. He denies any fevers, chills, shortness of breath, chest pain, cough, edema, neuropathy, urinary symptoms, or skin changes. He has no other complaints. Rest of the 10 point ROS is below.   MEDICAL HISTORY:  Past Medical History:  Diagnosis Date   Anemia 11/2010   duodenal ulcer, s/p transfusion   Asthma    Back pain    resolved   Diabetes mellitus    Dyslipidemia    Erectile dysfunction    H. pylori infection 03/2012   seen on EGD with gastritis, duodenitis and duodenal ulcer   Hiatal hernia 03/2012   Hypertension    Orbital fracture ([place]) 11/2010   left (due to fall)   PUD (peptic ulcer disease)    11/2010; duodenal ulcer    SURGICAL HISTORY: Past Surgical History:  Procedure Laterality Date   ESOPHAGOGASTRODUODENOSCOPY  04/01/12    SOCIAL HISTORY: Social History   Socioeconomic History   Marital status: Single    Spouse name: Not on file   Number of children: 0   Years of education: Not on file   Highest education level: Not on file  Occupational History   Occupation: COOK    Employer: [employer]  Tobacco Use   Smoking status: Never   Smokeless tobacco: Never  Substance and Sexual Activity    Alcohol use: Yes    Comment: occasionally   Drug use: No    Comment: h/o marijuana use.  denies other drug use   Sexual activity: Yes    Partners: Female  Other Topics Concern   Not on file  Social History Narrative   Lives with his sister. Passive tobacco exposure from his sister.  Cook at [employer]   Social Determinants of Health   Financial Resource Strain: Not on file  Food Insecurity: Not on file  Transportation Needs: Not on file  Physical Activity: Not on file  Stress: Not on file  Social Connections: Not on file  Intimate Partner Violence: Not on file    FAMILY HISTORY: Family History  Problem Relation Age of Onset   Heart disease Mother    Alzheimer's disease Mother    Heart disease Father 47       MI   Diabetes Sister    Diabetes Sister    Cancer Maternal Grandmother        male cancer   Colon cancer Neg Hx    Esophageal cancer Neg Hx    Rectal cancer Neg Hx    Stomach cancer Neg Hx    Stroke Neg Hx     ALLERGIES:  has No Known Allergies.  MEDICATIONS:  Current Outpatient Medications  Medication Sig Dispense Refill   aspirin EC 81 MG tablet Take 81 mg by mouth  daily. Swallow whole.     albuterol (PROVENTIL) (2.5 MG/3ML) 0.083% nebulizer solution Take 3 mLs (2.5 mg total) by nebulization every 4 (four) hours as needed for wheezing or shortness of breath. (Patient not taking: No sig reported) 30 vial 0   furosemide (LASIX) 40 MG tablet Take 1 tablet (40 mg total) by mouth daily. (Patient not taking: Reported on 02/22/2021) 5 tablet 0   lisinopril-hydrochlorothiazide (PRINZIDE,ZESTORETIC) 20-25 MG per tablet Take 1 tablet by mouth daily. 30 tablet 0   metoprolol tartrate (LOPRESSOR) 25 MG tablet Take 25 mg by mouth 2 (two) times daily.     pioglitazone (ACTOS) 45 MG tablet TAKE 1 TABLET BY MOUTH EVERY DAY 30 tablet 0   predniSONE (DELTASONE) 20 MG tablet 3 tabs po day one, then 2 po daily x 4 days (Patient not taking: No sig reported) 11 tablet 0    No current facility-administered medications for this visit.    REVIEW OF SYSTEMS:   Constitutional: ( - ) fevers, ( - )  chills , ( - ) night sweats Eyes: ( - ) blurriness of vision, ( - ) double vision, ( - ) watery eyes Ears, nose, mouth, throat, and face: ( - ) mucositis, ( - ) sore throat Respiratory: ( - ) cough, ( - ) dyspnea, ( - ) wheezes Cardiovascular: ( - ) palpitation, ( - ) chest discomfort, ( - ) lower extremity swelling Gastrointestinal:  ( - ) nausea, ( - ) heartburn, ( - ) change in bowel habits Skin: ( - ) abnormal skin rashes Lymphatics: ( - ) new lymphadenopathy, ( - ) easy bruising Neurological: ( - ) numbness, ( - ) tingling, ( - ) new weaknesses Behavioral/Psych: ( - ) mood change, ( - ) new changes  All other systems were reviewed with the patient and are negative.  PHYSICAL EXAMINATION: ECOG PERFORMANCE STATUS: 0 - Asymptomatic  Vitals:   02/22/21 1511  BP: (!) 173/94  Pulse: 68  Resp: 18  SpO2: 100%   Filed Weights   02/22/21 1511  Weight: 234 lb 6.4 oz (106.3 kg)    GENERAL: well appearing African American male in NAD  SKIN: skin color, texture, turgor are normal, no rashes or significant lesions EYES: conjunctiva are pink and non-injected, sclera clear OROPHARYNX: no exudate, no erythema; lips, buccal mucosa, and tongue normal  LUNGS: clear to auscultation and percussion with normal breathing effort HEART: regular rate & rhythm and no murmurs and no lower extremity edema ABDOMEN: soft, non-tender, non-distended, normal bowel sounds Musculoskeletal: no cyanosis of digits and no clubbing  PSYCH: alert & oriented x 3, fluent speech NEURO: no focal motor/sensory deficits  LABORATORY DATA:  I have reviewed the data as listed CBC Latest Ref Rng & Units 02/22/2021 11/01/2020 06/10/2020  WBC 4.0 - 10.5 K/uL 4.7 4.6 -  Hemoglobin 13.0 - 17.0 g/dL 10.7(L) 11.7(L) 13.6  Hematocrit 39.0 - 52.0 % 34.2(L) 36.9(L) 40.0  Platelets 150 - 400 K/uL 161 184 -     CMP Latest Ref Rng & Units 02/22/2021 11/01/2020 06/10/2020  Glucose 70 - 99 mg/dL 93 84 86  BUN 8 - 23 mg/dL 64(H) 73(H) 51(H)  Creatinine 0.61 - 1.24 mg/dL 5.96(HH) 5.79(HH) 4.40(H)  Sodium 135 - 145 mmol/L 146(H) 146(H) 143  Potassium 3.5 - 5.1 mmol/L 5.0 5.5(H) 5.0  Chloride 98 - 111 mmol/L 118(H) 115(H) 112(H)  CO2 22 - 32 mmol/L 20(L) 20(L) -  Calcium 8.9 - 10.3 mg/dL 7.7(L) 8.5(L) -  Total Protein  6.5 - 8.1 g/dL 6.6 7.0 -  Total Bilirubin 0.3 - 1.2 mg/dL 0.3 0.4 -  Alkaline Phos 38 - 126 U/L 86 97 -  AST 15 - 41 U/L 14(L) 17 -  ALT 0 - 44 U/L 17 23 -    ASSESSMENT & PLAN Anthony Owens is a 65 y.o. male presenting to the clinic for evaluation for monoclonal gammopathy. Patient was referred by Dr. Harrie Jeans from [facility] but outside labs are unavailable for review. Recommend full MGUS workup with CBC, CMP, multiple myeloma panel, kappa/lambda light chain, UPEP, beta 2 microglobulin and LDH levels. Bone Survey will be obtained to evaluate for lytic lesions. Plan to follow up with results by phone. If no additional intervention is required, we will plan to see patient back in clinic in 3 months.    #MGUS: --DG bone met survey from 11/08/2020 shows no evidence of lytic lesions.  --UPEP from 11/15/2020 shows M-protein of 42 mg/24h, SPEP from 11/01/2020 showed M protein of 0.2 and IFC showed IgG monoclonal protein with kappa light chain specificity. sFLC from 11/26/2020 showed ratio of 3.27 in the setting of CKD.  --Labs today show Hgb 10.7, Creatinine is 5.96, Calcium 7.7. MM panel and serum free light chains pending. UPEP pending.  --RTC in 6 months unless remaining labs require further intervention.   #CKD Stage 5: --Under the care of Dr. Harrie Jeans from [facility]. --Patient is not undergoing dialysis and reports that he is not interested at this time.   No orders of the defined types were placed in this encounter.   All questions were  answered. The patient knows to call the clinic with any problems, questions or concerns.  I have spent a total of 25 minutes minutes of face-to-face and non-face-to-face time, preparing to see the patient, performing a medically appropriate examination, counseling and educating the patient, documenting clinical information in the electronic health record, and care coordination.   Dede Query, PA-C Department of Hematology/Oncology [place] at [facility] Phone: [phone]

## 2021-02-25 LAB — MULTIPLE MYELOMA PANEL, SERUM
Albumin SerPl Elph-Mcnc: 3.2 g/dL (ref 2.9–4.4)
Albumin/Glob SerPl: 1.2 (ref 0.7–1.7)
Alpha 1: 0.2 g/dL (ref 0.0–0.4)
Alpha2 Glob SerPl Elph-Mcnc: 0.7 g/dL (ref 0.4–1.0)
B-Globulin SerPl Elph-Mcnc: 0.9 g/dL (ref 0.7–1.3)
Gamma Glob SerPl Elph-Mcnc: 1 g/dL (ref 0.4–1.8)
Globulin, Total: 2.7 g/dL (ref 2.2–3.9)
IgA: 184 mg/dL (ref 61–437)
IgG (Immunoglobin G), Serum: 1040 mg/dL (ref 603–1613)
IgM (Immunoglobulin M), Srm: 27 mg/dL (ref 20–172)
M Protein SerPl Elph-Mcnc: 0.2 g/dL — ABNORMAL HIGH
Total Protein ELP: 5.9 g/dL — ABNORMAL LOW (ref 6.0–8.5)

## 2021-03-02 ENCOUNTER — Telehealth: Payer: Self-pay | Admitting: Physician Assistant

## 2021-03-02 NOTE — Telephone Encounter (Signed)
I called [name] to review the lab results from 02/22/2021. SPEP revealed stable M-protein of 0.2. IFE showed IgG monoclonal protein with kapp light chain specificity. Serum free light chain ratio is elevated at 3.35 in the setting of CKD. Creatinine level is 5.96 and patient has not started dialysis. I discussed findings with Dr. Foster, from [facility]. Patient's underlying CKD is secondary to DM and HTN. However, it would be beneficial to rule out paraproteinemia as the cause for the worsening renal function. We recommend patient undergo bone marrow biopsy to further evaluate this. Patient is agreement with proceeding with the bone marrow biopsy. I will reach out to the patient to review the biopsy results once available.   Patient expressed understanding of the plan provided.  

## 2021-03-04 ENCOUNTER — Other Ambulatory Visit: Payer: Self-pay | Admitting: Physician Assistant

## 2021-03-04 ENCOUNTER — Telehealth: Payer: Self-pay

## 2021-03-04 DIAGNOSIS — D472 Monoclonal gammopathy: Secondary | ICD-10-CM

## 2021-03-04 NOTE — Telephone Encounter (Signed)
Pt has been scheduled for 04/08/21. Pt is aware.

## 2021-03-04 NOTE — Telephone Encounter (Signed)
I have left a message for IR to schedule pts bone marrow bx. I have left my direct number as the return call number.

## 2021-04-06 ENCOUNTER — Other Ambulatory Visit (HOSPITAL_COMMUNITY): Payer: Self-pay | Admitting: Physician Assistant

## 2021-04-07 ENCOUNTER — Other Ambulatory Visit: Payer: Self-pay | Admitting: Internal Medicine

## 2021-04-07 ENCOUNTER — Other Ambulatory Visit: Payer: Self-pay | Admitting: Radiology

## 2021-04-08 ENCOUNTER — Ambulatory Visit (HOSPITAL_COMMUNITY): Payer: Managed Care, Other (non HMO)

## 2021-04-08 ENCOUNTER — Ambulatory Visit (HOSPITAL_COMMUNITY): Admission: RE | Admit: 2021-04-08 | Payer: Managed Care, Other (non HMO) | Source: Ambulatory Visit

## 2021-04-14 ENCOUNTER — Telehealth: Payer: Self-pay | Admitting: *Deleted

## 2021-04-14 NOTE — Telephone Encounter (Signed)
Contacted patient at request of Anthony Owens, [place]. Patient cancelled CT biopsy scheduled on 04/08/21 per appt note.  Left following message on named VM: Ms. Anthony Owens noted that you did not have your CT biopsy as scheduled. This test provides information needed for your treatment.  Please contact office of Ms. Anthony Owens/Dr. Lorenso Owens at CC# [phone]. You may also call Central Scheduling at [phone] to reschedule.

## 2021-04-15 ENCOUNTER — Telehealth: Payer: Self-pay

## 2021-04-15 NOTE — Telephone Encounter (Signed)
Attempted to call pt today to r/s his cancelled appt for BMBX. Pt did not answer. Provided number to [facility] for pt to call us back and r/s.

## 2021-04-22 ENCOUNTER — Telehealth: Payer: Self-pay | Admitting: Physician Assistant

## 2021-04-22 NOTE — Telephone Encounter (Signed)
I called Anthony Owens as he cancelled the bone marrow biopsy that was scheduled on 04/08/2021.  Patient asked if the bone marrow biopsy is necessary.  I explained that it is important to evaluate if worsening renal function is secondary to monoclonal gammopathy and we need to rule out plasma cell disorders including multiple myeloma.  Patient agreed to rescheduling the bone marrow biopsy.  I will reach out to interventional radiology to get the bone marrow biopsy scheduled.

## 2021-05-05 ENCOUNTER — Telehealth: Payer: Self-pay

## 2021-05-05 NOTE — Telephone Encounter (Signed)
Mr Anthony Owens has been rescheduled to 06/17/21 at 11:00 arriving at 9:00 to [facility] admitting. pt advised and instructions given for NPO, hold ASA and will need a driver. pt with VU.

## 2021-06-15 ENCOUNTER — Other Ambulatory Visit: Payer: Self-pay | Admitting: Student

## 2021-06-17 ENCOUNTER — Ambulatory Visit (HOSPITAL_COMMUNITY): Admission: RE | Admit: 2021-06-17 | Payer: Managed Care, Other (non HMO) | Source: Ambulatory Visit

## 2021-06-17 ENCOUNTER — Ambulatory Visit (HOSPITAL_COMMUNITY): Payer: Managed Care, Other (non HMO)

## 2021-07-29 ENCOUNTER — Other Ambulatory Visit: Payer: Self-pay | Admitting: *Deleted

## 2021-07-29 DIAGNOSIS — N185 Chronic kidney disease, stage 5: Secondary | ICD-10-CM

## 2021-08-01 ENCOUNTER — Ambulatory Visit (HOSPITAL_COMMUNITY)
Admission: RE | Admit: 2021-08-01 | Discharge: 2021-08-01 | Disposition: A | Discharge status: Home / Self Care | Payer: Managed Care, Other (non HMO) | Source: Ambulatory Visit | Attending: Vascular Surgery | Admitting: Vascular Surgery

## 2021-08-01 ENCOUNTER — Other Ambulatory Visit: Payer: Self-pay

## 2021-08-01 ENCOUNTER — Ambulatory Visit (INDEPENDENT_AMBULATORY_CARE_PROVIDER_SITE_OTHER)
Admission: RE | Admit: 2021-08-01 | Discharge: 2021-08-01 | Disposition: A | Discharge status: Home / Self Care | Payer: Managed Care, Other (non HMO) | Source: Ambulatory Visit | Attending: Vascular Surgery | Admitting: Vascular Surgery

## 2021-08-01 DIAGNOSIS — N185 Chronic kidney disease, stage 5: Secondary | ICD-10-CM | POA: Insufficient documentation

## 2021-08-02 ENCOUNTER — Other Ambulatory Visit: Payer: Self-pay

## 2021-08-02 ENCOUNTER — Encounter: Payer: Self-pay | Admitting: Vascular Surgery

## 2021-08-02 ENCOUNTER — Ambulatory Visit (INDEPENDENT_AMBULATORY_CARE_PROVIDER_SITE_OTHER): Payer: Managed Care, Other (non HMO) | Admitting: Vascular Surgery

## 2021-08-02 DIAGNOSIS — Z992 Dependence on renal dialysis: Secondary | ICD-10-CM

## 2021-08-02 DIAGNOSIS — N186 End stage renal disease: Secondary | ICD-10-CM | POA: Insufficient documentation

## 2021-08-02 NOTE — Progress Notes (Signed)
Patient name: Anthony Owens. Minion MRN: [id number] DOB: [birth year] Sex: male  REASON FOR CONSULT: Follow-up to discuss permanent dialysis access  HPI: Anthony Owens is a 66 y.o. male, with history of hypertension and now ESRD on HD Tues/Thurs/Sat that presents to further discuss permanent dialysis access.  Patient is right-handed.  He works as a Training and development officer at [employer].  He's had no previous access in the past.  I initially saw him on 11/30/2020 and at that time he was stage V CKD and had not started dialysis.  He has since had a right IJ catheter placed.  Wanted to further talk with his nephrologist before scheduling access surgery last time we visited with him.  Past Medical History:  Diagnosis Date   Anemia 11/2010   duodenal ulcer, s/p transfusion   Asthma    Back pain    resolved   Diabetes mellitus    Dyslipidemia    Erectile dysfunction    H. pylori infection 03/2012   seen on EGD with gastritis, duodenitis and duodenal ulcer   Hiatal hernia 03/2012   Hypertension    Orbital fracture ([place]) 11/2010   left (due to fall)   PUD (peptic ulcer disease)    11/2010; duodenal ulcer    Past Surgical History:  Procedure Laterality Date   ESOPHAGOGASTRODUODENOSCOPY  04/01/12    Family History  Problem Relation Age of Onset   Heart disease Mother    Alzheimer's disease Mother    Heart disease Father 24       MI   Diabetes Sister    Diabetes Sister    Cancer Maternal Grandmother        male cancer   Colon cancer Neg Hx    Esophageal cancer Neg Hx    Rectal cancer Neg Hx    Stomach cancer Neg Hx    Stroke Neg Hx     SOCIAL HISTORY: Social History   Socioeconomic History   Marital status: Single    Spouse name: Not on file   Number of children: 0   Years of education: Not on file   Highest education level: Not on file  Occupational History   Occupation: COOK    Employer: [employer]  Tobacco Use   Smoking status: Never   Smokeless tobacco:  Never  Substance and Sexual Activity   Alcohol use: Yes    Comment: occasionally   Drug use: No    Comment: h/o marijuana use.  denies other drug use   Sexual activity: Yes    Partners: Female  Other Topics Concern   Not on file  Social History Narrative   Lives with his sister. Passive tobacco exposure from his sister.  Cook at [employer]   Social Determinants of Health   Financial Resource Strain: Not on file  Food Insecurity: Not on file  Transportation Needs: Not on file  Physical Activity: Not on file  Stress: Not on file  Social Connections: Not on file  Intimate Partner Violence: Not on file    No Known Allergies  Current Outpatient Medications  Medication Sig Dispense Refill   lisinopril-hydrochlorothiazide (PRINZIDE,ZESTORETIC) 20-25 MG per tablet Take 1 tablet by mouth daily. 30 tablet 0   albuterol (PROVENTIL) (2.5 MG/3ML) 0.083% nebulizer solution Take 3 mLs (2.5 mg total) by nebulization every 4 (four) hours as needed for wheezing or shortness of breath. (Patient not taking: Reported on 11/30/2020) 30 vial 0   aspirin EC 81 MG tablet Take  81 mg by mouth daily. Swallow whole.     furosemide (LASIX) 40 MG tablet Take 1 tablet (40 mg total) by mouth daily. (Patient not taking: Reported on 02/22/2021) 5 tablet 0   metoprolol tartrate (LOPRESSOR) 25 MG tablet Take 25 mg by mouth 2 (two) times daily.     pioglitazone (ACTOS) 45 MG tablet TAKE 1 TABLET BY MOUTH EVERY DAY (Patient not taking: Reported on 08/02/2021) 30 tablet 0   predniSONE (DELTASONE) 20 MG tablet 3 tabs po day one, then 2 po daily x 4 days (Patient not taking: Reported on 11/30/2020) 11 tablet 0   No current facility-administered medications for this visit.    REVIEW OF SYSTEMS:  [X]  denotes positive finding, [ ]  denotes negative finding Cardiac  Comments:  Chest pain or chest pressure:    Shortness of breath upon exertion:    Short of breath when lying flat:    Irregular heart rhythm:         Vascular    Pain in calf, thigh, or hip brought on by ambulation:    Pain in feet at night that wakes you up from your sleep:     Blood clot in your veins:    Leg swelling:         Pulmonary    Oxygen at home:    Productive cough:     Wheezing:         Neurologic    Sudden weakness in arms or legs:     Sudden numbness in arms or legs:     Sudden onset of difficulty speaking or slurred speech:    Temporary loss of vision in one eye:     Problems with dizziness:         Gastrointestinal    Blood in stool:     Vomited blood:         Genitourinary    Burning when urinating:     Blood in urine:        Psychiatric    Major depression:         Hematologic    Bleeding problems:    Problems with blood clotting too easily:        Skin    Rashes or ulcers:        Constitutional    Fever or chills:      PHYSICAL EXAM: Vitals:   08/02/21 1137  BP: (!) 147/92  Pulse: 96  Resp: 18  Temp: 97.9 F (36.6 C)  TempSrc: Temporal  SpO2: 94%  Weight: 227 lb (103 kg)  Height: 5' 11.5" (1.816 m)    GENERAL: The patient is a well-nourished male, in no acute distress. The vital signs are documented above. CARDIAC: There is a regular rate and rhythm.  VASCULAR:  Right IJ tunneled dialysis catheter  Palpable radial/brachial pulses bilateral upper extremities No upper extremity tissue loss PULMONARY: No respiratory distress. ABDOMEN: Soft and non-tender. MUSCULOSKELETAL: There are no major deformities or cyanosis. NEUROLOGIC: No focal weakness or paresthesias are detected. SKIN: There are no ulcers or rashes noted. PSYCHIATRIC: The patient has a normal affect.  DATA:   Upper extremity arterial duplex shows triphasic waveforms in both upper extremities  Upper extremity vein mapping shows a good cephalic and basilic vein in the left arm  Assessment/Plan:  66 year old male that I previously saw with stage V CKD that has now progressed to end-stage renal disease on  hemodialysis Tuesday Thursday Saturday that presents for permanent dialysis access.  Patient  is right-handed and again recommended left arm access placement.  Appears to have a good cephalic and basilic vein in the left arm based on vein mapping.  We will get him scheduled for Monday in the OR given he is off of work that day and it is not a dialysis day.  Discussed steps of the surgery as well as risk of bleeding, infection, failure to mature, steal syndrome.  Questions answered.  We will get him scheduled today.   Marty Heck, MD [facility] Office: [phone]

## 2021-08-04 ENCOUNTER — Encounter (HOSPITAL_COMMUNITY): Payer: Self-pay | Admitting: Vascular Surgery

## 2021-08-05 ENCOUNTER — Telehealth: Payer: Self-pay | Admitting: Physician Assistant

## 2021-08-05 ENCOUNTER — Encounter (HOSPITAL_COMMUNITY): Payer: Self-pay | Admitting: Vascular Surgery

## 2021-08-05 ENCOUNTER — Other Ambulatory Visit: Payer: Self-pay

## 2021-08-05 NOTE — Telephone Encounter (Signed)
Called patient regarding upcoming appointments, left a voicemail. 

## 2021-08-05 NOTE — Progress Notes (Addendum)
Anthony Owens denies chest pain or shortness. Patient denies having any s/s of Covid in his household.  Patient denies any known exposure to Covid.  Anthony Owens has type II diabetes, he is not on medication.  Patient checks CBG 2 times a week CBGs run 130-140.  I instructed Anthony Owens to wash up well with antibiotic soap, if it is available.  Dry off with a clean towel. Do not put lotion, powder, cologne or deodorant or makeup.No jewelry or piercings. Men may shave their face and neck. Woman should not shave. No nail polish, artificial or acrylic nails. Wear clean clothes, brush your teeth. Glasses, contact lens,dentures or partials may not be worn in the OR. If you need to wear them, please bring a case for glasses, do not wear contacts or bring a case, the hospital does not have contact cases, dentures or partials will have to be removed , make sure they are clean, we will provide a denture cup to put them in. You will need some one to drive you home and a responsible person over the age of 51 to stay with you for the first 24 hours after surgery.   I called Anthony Owens back and asked him to check CBG when he awakes and every 2 hours until he leaves to come to the hospital., if CBG is < 70 , drink 4 oz/1/2 cup of apple, cranberry or grape juice.  Recheck CBG in 15 mins, if it is not above 70 call the pre- op desk number , ask for further instructions.

## 2021-08-07 NOTE — Anesthesia Preprocedure Evaluation (Addendum)
Anesthesia Evaluation  Patient identified by MRN, date of birth, ID band Patient awake    Reviewed: Allergy & Precautions, NPO status , Patient's Chart, lab work & pertinent test results  History of Anesthesia Complications Negative for: history of anesthetic complications  Airway Mallampati: II  TM Distance: >3 FB Neck ROM: Full    Dental  (+) Dental Advisory Given   Pulmonary asthma ,    Pulmonary exam normal        Cardiovascular hypertension, Pt. on medications Normal cardiovascular exam     Neuro/Psych negative neurological ROS  negative psych ROS   GI/Hepatic Neg liver ROS, hiatal hernia, PUD,   Endo/Other  diabetes, Type 2 Obesity   Renal/GU ESRF and DialysisRenal disease     Musculoskeletal  (+) Arthritis ,   Abdominal   Peds  Hematology  (+) Blood dyscrasia, anemia ,   Anesthesia Other Findings   Reproductive/Obstetrics                            Anesthesia Physical Anesthesia Plan  ASA: 3  Anesthesia Plan: MAC   Post-op Pain Management: Tylenol PO (pre-op)   Induction: Intravenous  PONV Risk Score and Plan: 1 and Treatment may vary due to age or medical condition, Propofol infusion and Ondansetron  Airway Management Planned: Natural Airway and Simple Face Mask  Additional Equipment: None  Intra-op Plan:   Post-operative Plan:   Informed Consent: I have reviewed the patients History and Physical, chart, labs and discussed the procedure including the risks, benefits and alternatives for the proposed anesthesia with the patient or authorized representative who has indicated his/her understanding and acceptance.       Plan Discussed with: CRNA and Anesthesiologist  Anesthesia Plan Comments:        Anesthesia Quick Evaluation

## 2021-08-08 ENCOUNTER — Ambulatory Visit (HOSPITAL_COMMUNITY)
Admission: RE | Admit: 2021-08-08 | Discharge: 2021-08-08 | Disposition: A | Discharge status: Home / Self Care | Payer: Managed Care, Other (non HMO) | Attending: Vascular Surgery | Admitting: Vascular Surgery

## 2021-08-08 ENCOUNTER — Encounter (HOSPITAL_COMMUNITY): Payer: Self-pay | Admitting: Vascular Surgery

## 2021-08-08 ENCOUNTER — Ambulatory Visit (HOSPITAL_COMMUNITY): Payer: Managed Care, Other (non HMO) | Admitting: Anesthesiology

## 2021-08-08 ENCOUNTER — Encounter (HOSPITAL_COMMUNITY)
Admission: RE | Disposition: A | Discharge status: Home / Self Care | Payer: Self-pay | Source: Home / Self Care | Attending: Vascular Surgery

## 2021-08-08 ENCOUNTER — Other Ambulatory Visit: Payer: Self-pay

## 2021-08-08 ENCOUNTER — Ambulatory Visit (HOSPITAL_BASED_OUTPATIENT_CLINIC_OR_DEPARTMENT_OTHER): Payer: Managed Care, Other (non HMO) | Admitting: Anesthesiology

## 2021-08-08 DIAGNOSIS — N186 End stage renal disease: Secondary | ICD-10-CM

## 2021-08-08 DIAGNOSIS — I120 Hypertensive chronic kidney disease with stage 5 chronic kidney disease or end stage renal disease: Secondary | ICD-10-CM | POA: Diagnosis present

## 2021-08-08 DIAGNOSIS — Z992 Dependence on renal dialysis: Secondary | ICD-10-CM

## 2021-08-08 DIAGNOSIS — E1122 Type 2 diabetes mellitus with diabetic chronic kidney disease: Secondary | ICD-10-CM | POA: Insufficient documentation

## 2021-08-08 DIAGNOSIS — Z7984 Long term (current) use of oral hypoglycemic drugs: Secondary | ICD-10-CM | POA: Diagnosis not present

## 2021-08-08 DIAGNOSIS — N185 Chronic kidney disease, stage 5: Secondary | ICD-10-CM

## 2021-08-08 HISTORY — DX: Chronic kidney disease, unspecified: N18.9

## 2021-08-08 HISTORY — PX: AV FISTULA PLACEMENT: SHX1204

## 2021-08-08 HISTORY — DX: End stage renal disease: N18.6

## 2021-08-08 LAB — POCT I-STAT, CHEM 8
BUN: 44 mg/dL — ABNORMAL HIGH (ref 8–23)
Calcium, Ion: 0.92 mmol/L — ABNORMAL LOW (ref 1.15–1.40)
Chloride: 106 mmol/L (ref 98–111)
Creatinine, Ser: 8.1 mg/dL — ABNORMAL HIGH (ref 0.61–1.24)
Glucose, Bld: 89 mg/dL (ref 70–99)
HCT: 33 % — ABNORMAL LOW (ref 39.0–52.0)
Hemoglobin: 11.2 g/dL — ABNORMAL LOW (ref 13.0–17.0)
Potassium: 4 mmol/L (ref 3.5–5.1)
Sodium: 142 mmol/L (ref 135–145)
TCO2: 25 mmol/L (ref 22–32)

## 2021-08-08 LAB — GLUCOSE, CAPILLARY
Glucose-Capillary: 87 mg/dL (ref 70–99)
Glucose-Capillary: 86 mg/dL (ref 70–99)

## 2021-08-08 SURGERY — ARTERIOVENOUS (AV) FISTULA CREATION
Anesthesia: Monitor Anesthesia Care | Site: Arm Upper | Laterality: Left

## 2021-08-08 MED ORDER — PHENYLEPHRINE HCL-NACL 20-0.9 MG/250ML-% IV SOLN
INTRAVENOUS | Status: DC | PRN
  Administered 2021-08-08: 20 ug/min via INTRAVENOUS

## 2021-08-08 MED ORDER — FENTANYL CITRATE (PF) 250 MCG/5ML IJ SOLN
INTRAMUSCULAR | Status: AC
  Filled 2021-08-08: qty 5

## 2021-08-08 MED ORDER — CHLORHEXIDINE GLUCONATE 4 % EX LIQD: 60.0000 mL | Freq: Once | CUTANEOUS | Status: DC

## 2021-08-08 MED ORDER — ONDANSETRON HCL 4 MG/2ML IJ SOLN
INTRAMUSCULAR | Status: AC
  Filled 2021-08-08: qty 2

## 2021-08-08 MED ORDER — FENTANYL CITRATE (PF) 100 MCG/2ML IJ SOLN: 25.0000 ug | INTRAMUSCULAR | Status: DC | PRN

## 2021-08-08 MED ORDER — PROPOFOL 500 MG/50ML IV EMUL
INTRAVENOUS | Status: DC | PRN
  Administered 2021-08-08: 75 ug/kg/min via INTRAVENOUS

## 2021-08-08 MED ORDER — ORAL CARE MOUTH RINSE: 15.0000 mL | Freq: Once | OROMUCOSAL | Status: AC

## 2021-08-08 MED ORDER — PROPOFOL 10 MG/ML IV BOLUS
INTRAVENOUS | Status: AC
  Filled 2021-08-08: qty 20

## 2021-08-08 MED ORDER — 0.9 % SODIUM CHLORIDE (POUR BTL) OPTIME
TOPICAL | Status: DC | PRN
Start: 2021-08-08 — End: 2021-08-08
  Administered 2021-08-08: 1000 mL

## 2021-08-08 MED ORDER — HYDROCODONE-ACETAMINOPHEN 5-325 MG PO TABS: 1.0000 | ORAL_TABLET | Freq: Four times a day (QID) | ORAL | 0 refills | Status: AC | PRN

## 2021-08-08 MED ORDER — FENTANYL CITRATE (PF) 250 MCG/5ML IJ SOLN
INTRAMUSCULAR | Status: DC | PRN
  Administered 2021-08-08: 50 ug via INTRAVENOUS

## 2021-08-08 MED ORDER — ONDANSETRON HCL 4 MG/2ML IJ SOLN: 4.0000 mg | Freq: Once | INTRAMUSCULAR | Status: DC | PRN

## 2021-08-08 MED ORDER — LIDOCAINE HCL 1 % IJ SOLN
INTRAMUSCULAR | Status: DC | PRN
  Administered 2021-08-08: 10 mL

## 2021-08-08 MED ORDER — MIDAZOLAM HCL 2 MG/2ML IJ SOLN
INTRAMUSCULAR | Status: AC
  Filled 2021-08-08: qty 2

## 2021-08-08 MED ORDER — OXYCODONE HCL 5 MG/5ML PO SOLN: 5.0000 mg | Freq: Once | ORAL | Status: DC | PRN

## 2021-08-08 MED ORDER — OXYCODONE HCL 5 MG PO TABS: 5.0000 mg | ORAL_TABLET | Freq: Once | ORAL | Status: DC | PRN

## 2021-08-08 MED ORDER — SODIUM CHLORIDE 0.9 % IV SOLN: INTRAVENOUS | Status: DC

## 2021-08-08 MED ORDER — HEPARIN 6000 UNIT IRRIGATION SOLUTION
Status: DC | PRN
  Administered 2021-08-08: 1

## 2021-08-08 MED ORDER — CEFAZOLIN SODIUM-DEXTROSE 2-4 GM/100ML-% IV SOLN
2.0000 g | INTRAVENOUS | Status: AC
  Administered 2021-08-08: 2 g via INTRAVENOUS
  Filled 2021-08-08: qty 100

## 2021-08-08 MED ORDER — HEPARIN 6000 UNIT IRRIGATION SOLUTION
Status: AC
  Filled 2021-08-08: qty 500

## 2021-08-08 MED ORDER — MIDAZOLAM HCL 2 MG/2ML IJ SOLN
INTRAMUSCULAR | Status: DC | PRN
  Administered 2021-08-08 (×2): 1 mg via INTRAVENOUS

## 2021-08-08 MED ORDER — LIDOCAINE 2% (20 MG/ML) 5 ML SYRINGE
INTRAMUSCULAR | Status: AC
  Filled 2021-08-08: qty 5

## 2021-08-08 MED ORDER — HEPARIN SODIUM (PORCINE) 1000 UNIT/ML IJ SOLN
INTRAMUSCULAR | Status: DC | PRN
Start: 2021-08-08 — End: 2021-08-08
  Administered 2021-08-08: 3000 [IU] via INTRAVENOUS

## 2021-08-08 MED ORDER — LIDOCAINE HCL (PF) 1 % IJ SOLN
INTRAMUSCULAR | Status: AC
  Filled 2021-08-08: qty 30

## 2021-08-08 MED ORDER — PROPOFOL 1000 MG/100ML IV EMUL
INTRAVENOUS | Status: AC
  Filled 2021-08-08: qty 100

## 2021-08-08 MED ORDER — ONDANSETRON HCL 4 MG/2ML IJ SOLN
INTRAMUSCULAR | Status: DC | PRN
  Administered 2021-08-08: 4 mg via INTRAVENOUS

## 2021-08-08 MED ORDER — CHLORHEXIDINE GLUCONATE 0.12 % MT SOLN
15.0000 mL | Freq: Once | OROMUCOSAL | Status: AC
  Administered 2021-08-08: 15 mL via OROMUCOSAL
  Filled 2021-08-08: qty 15

## 2021-08-08 SURGICAL SUPPLY — 36 items
ADH SKN CLS APL DERMABOND .7 (GAUZE/BANDAGES/DRESSINGS) ×1
AGENT HMST SPONGE THK3/8 (HEMOSTASIS)
ARMBAND PINK RESTRICT EXTREMIT (MISCELLANEOUS) ×3 IMPLANT
BAG COUNTER SPONGE SURGICOUNT (BAG) ×2 IMPLANT
BAG SPNG CNTER NS LX DISP (BAG) ×1
BLADE CLIPPER SURG (BLADE) ×2 IMPLANT
CANISTER SUCT 3000ML PPV (MISCELLANEOUS) ×2 IMPLANT
CLIP TI MEDIUM 6 (CLIP) ×1 IMPLANT
CLIP TI WIDE RED SMALL 6 (CLIP) ×1 IMPLANT
COVER PROBE W GEL 5X96 (DRAPES) ×2 IMPLANT
DERMABOND ADVANCED (GAUZE/BANDAGES/DRESSINGS) ×1
DERMABOND ADVANCED .7 DNX12 (GAUZE/BANDAGES/DRESSINGS) ×1 IMPLANT
ELECT REM PT RETURN 9FT ADLT (ELECTROSURGICAL) ×2
ELECTRODE REM PT RTRN 9FT ADLT (ELECTROSURGICAL) ×1 IMPLANT
GLOVE SRG 8 PF TXTR STRL LF DI (GLOVE) ×1 IMPLANT
GLOVE SURG ENC MOIS LTX SZ7.5 (GLOVE) ×2 IMPLANT
GLOVE SURG UNDER POLY LF SZ8 (GLOVE) ×2
GOWN STRL REUS W/ TWL LRG LVL3 (GOWN DISPOSABLE) ×2 IMPLANT
GOWN STRL REUS W/ TWL XL LVL3 (GOWN DISPOSABLE) ×2 IMPLANT
GOWN STRL REUS W/TWL LRG LVL3 (GOWN DISPOSABLE) ×4
GOWN STRL REUS W/TWL XL LVL3 (GOWN DISPOSABLE) ×2
HEMOSTAT SPONGE AVITENE ULTRA (HEMOSTASIS) IMPLANT
KIT BASIN OR (CUSTOM PROCEDURE TRAY) ×2 IMPLANT
KIT TURNOVER KIT B (KITS) ×2 IMPLANT
NS IRRIG 1000ML POUR BTL (IV SOLUTION) ×2 IMPLANT
PACK CV ACCESS (CUSTOM PROCEDURE TRAY) ×2 IMPLANT
PAD ARMBOARD 7.5X6 YLW CONV (MISCELLANEOUS) ×4 IMPLANT
SPONGE T-LAP 18X18 ~~LOC~~+RFID (SPONGE) ×1 IMPLANT
SUT MNCRL AB 4-0 PS2 18 (SUTURE) ×2 IMPLANT
SUT PROLENE 6 0 BV (SUTURE) ×3 IMPLANT
SUT PROLENE 7 0 BV 1 (SUTURE) IMPLANT
SUT VIC AB 3-0 SH 27 (SUTURE) ×2
SUT VIC AB 3-0 SH 27X BRD (SUTURE) ×1 IMPLANT
TOWEL GREEN STERILE (TOWEL DISPOSABLE) ×2 IMPLANT
UNDERPAD 30X36 HEAVY ABSORB (UNDERPADS AND DIAPERS) ×2 IMPLANT
WATER STERILE IRR 1000ML POUR (IV SOLUTION) ×2 IMPLANT

## 2021-08-08 NOTE — Anesthesia Postprocedure Evaluation (Signed)
Anesthesia Post Note  Patient: Anthony Owens. Shipes  Procedure(s) Performed: LEFT ARM BRACHIOCEPHALIC ARTERIOVENOUS (AV) FISTULA CREATION (Left: Arm Upper)     Patient location during evaluation: PACU Anesthesia Type: MAC Level of consciousness: awake and alert Pain management: pain level controlled Vital Signs Assessment: post-procedure vital signs reviewed and stable Respiratory status: spontaneous breathing, nonlabored ventilation and respiratory function stable Cardiovascular status: stable and blood pressure returned to baseline Anesthetic complications: no   No notable events documented.  Last Vitals:  Vitals:   08/08/21 0900 08/08/21 0915  BP: (!) 147/94 (!) 154/75  Pulse: 79 79  Resp: 15 18  Temp: 36.5 C 36.5 C  SpO2: 100% 99%    Last Pain:  Vitals:   08/08/21 0915  TempSrc:   PainSc: 0-No pain                 Audry Pili

## 2021-08-08 NOTE — H&P (Signed)
History and Physical Interval Note:  08/08/2021 7:38 AM  Anthony Owens  has presented today for surgery, with the diagnosis of ESRD.  The various methods of treatment have been discussed with the patient and family. After consideration of risks, benefits and other options for treatment, the patient has consented to  Procedure(s): LEFT ARM ARTERIOVENOUS (AV) FISTULA CREATION (Left) as a surgical intervention.  The patient's history has been reviewed, patient examined, no change in status, stable for surgery.  I have reviewed the patient's chart and labs.  Questions were answered to the patient's satisfaction.    Left arm AVF.  Marty Heck  Patient name: Anthony Owens. [place]          MRN: [id number]        DOB: 12/14/1955          Sex: male   REASON FOR CONSULT: Follow-up to discuss permanent dialysis access   HPI: Anthony Owens is a 66 y.o. male, with history of hypertension and now ESRD on HD Tues/Thurs/Sat that presents to further discuss permanent dialysis access.  Patient is right-handed.  He works as a Training and development officer at [employer].  He's had no previous access in the past.  I initially saw him on 11/30/2020 and at that time he was stage V CKD and had not started dialysis.  He has since had a right IJ catheter placed.  Wanted to further talk with his nephrologist before scheduling access surgery last time we visited with him.       Past Medical History:  Diagnosis Date   Anemia 11/2010    duodenal ulcer, s/p transfusion   Asthma     Back pain      resolved   Diabetes mellitus     Dyslipidemia     Erectile dysfunction     H. pylori infection 03/2012    seen on EGD with gastritis, duodenitis and duodenal ulcer   Hiatal hernia 03/2012   Hypertension     Orbital fracture ([place]) 11/2010    left (due to fall)   PUD (peptic ulcer disease)      11/2010; duodenal ulcer           Past Surgical History:  Procedure Laterality Date   ESOPHAGOGASTRODUODENOSCOPY   04/01/12            Family History  Problem Relation Age of Onset   Heart disease Mother     Alzheimer's disease Mother     Heart disease Father 54        MI   Diabetes Sister     Diabetes Sister     Cancer Maternal Grandmother          male cancer   Colon cancer Neg Hx     Esophageal cancer Neg Hx     Rectal cancer Neg Hx     Stomach cancer Neg Hx     Stroke Neg Hx        SOCIAL HISTORY: Social History         Socioeconomic History   Marital status: Single      Spouse name: Not on file   Number of children: 0   Years of education: Not on file   Highest education level: Not on file  Occupational History   Occupation: COOK      Employer: [employer]  Tobacco Use   Smoking status: Never   Smokeless tobacco: Never  Substance and Sexual Activity   Alcohol use: Yes  Comment: occasionally   Drug use: No      Comment: h/o marijuana use.  denies other drug use   Sexual activity: Yes      Partners: Female  Other Topics Concern   Not on file  Social History Narrative    Lives with his sister. Passive tobacco exposure from his sister.  Cook at [employer]    Social Determinants of Health    Financial Resource Strain: Not on file  Food Insecurity: Not on file  Transportation Needs: Not on file  Physical Activity: Not on file  Stress: Not on file  Social Connections: Not on file  Intimate Partner Violence: Not on file      No Known Allergies         Current Outpatient Medications  Medication Sig Dispense Refill   lisinopril-hydrochlorothiazide (PRINZIDE,ZESTORETIC) 20-25 MG per tablet Take 1 tablet by mouth daily. 30 tablet 0   albuterol (PROVENTIL) (2.5 MG/3ML) 0.083% nebulizer solution Take 3 mLs (2.5 mg total) by nebulization every 4 (four) hours as needed for wheezing or shortness of breath. (Patient not taking: Reported on 11/30/2020) 30 vial 0   aspirin EC 81 MG tablet Take 81 mg by mouth daily. Swallow whole.       furosemide (LASIX) 40 MG tablet  Take 1 tablet (40 mg total) by mouth daily. (Patient not taking: Reported on 02/22/2021) 5 tablet 0   metoprolol tartrate (LOPRESSOR) 25 MG tablet Take 25 mg by mouth 2 (two) times daily.       pioglitazone (ACTOS) 45 MG tablet TAKE 1 TABLET BY MOUTH EVERY DAY (Patient not taking: Reported on 08/02/2021) 30 tablet 0   predniSONE (DELTASONE) 20 MG tablet 3 tabs po day one, then 2 po daily x 4 days (Patient not taking: Reported on 11/30/2020) 11 tablet 0    No current facility-administered medications for this visit.      REVIEW OF SYSTEMS:  [X]  denotes positive finding, [ ]  denotes negative finding Cardiac   Comments:  Chest pain or chest pressure:      Shortness of breath upon exertion:      Short of breath when lying flat:      Irregular heart rhythm:             Vascular      Pain in calf, thigh, or hip brought on by ambulation:      Pain in feet at night that wakes you up from your sleep:       Blood clot in your veins:      Leg swelling:              Pulmonary      Oxygen at home:      Productive cough:       Wheezing:              Neurologic      Sudden weakness in arms or legs:       Sudden numbness in arms or legs:       Sudden onset of difficulty speaking or slurred speech:      Temporary loss of vision in one eye:       Problems with dizziness:              Gastrointestinal      Blood in stool:       Vomited blood:              Genitourinary  Burning when urinating:       Blood in urine:             Psychiatric      Major depression:              Hematologic      Bleeding problems:      Problems with blood clotting too easily:             Skin      Rashes or ulcers:             Constitutional      Fever or chills:          PHYSICAL EXAM:    Vitals:    08/02/21 1137  BP: (!) 147/92  Pulse: 96  Resp: 18  Temp: 97.9 F (36.6 C)  TempSrc: Temporal  SpO2: 94%  Weight: 227 lb (103 kg)  Height: 5' 11.5" (1.816 m)      GENERAL: The patient is a  well-nourished male, in no acute distress. The vital signs are documented above. CARDIAC: There is a regular rate and rhythm.  VASCULAR:  Right IJ tunneled dialysis catheter  Palpable radial/brachial pulses bilateral upper extremities No upper extremity tissue loss PULMONARY: No respiratory distress. ABDOMEN: Soft and non-tender. MUSCULOSKELETAL: There are no major deformities or cyanosis. NEUROLOGIC: No focal weakness or paresthesias are detected. SKIN: There are no ulcers or rashes noted. PSYCHIATRIC: The patient has a normal affect.   DATA:    Upper extremity arterial duplex shows triphasic waveforms in both upper extremities   Upper extremity vein mapping shows a good cephalic and basilic vein in the left arm   Assessment/Plan:   66 year old male that I previously saw with stage V CKD that has now progressed to end-stage renal disease on hemodialysis Tuesday Thursday Saturday that presents for permanent dialysis access.  Patient is right-handed and again recommended left arm access placement.  Appears to have a good cephalic and basilic vein in the left arm based on vein mapping.  We will get him scheduled for Monday in the OR given he is off of work that day and it is not a dialysis day.  Discussed steps of the surgery as well as risk of bleeding, infection, failure to mature, steal syndrome.  Questions answered.  We will get him scheduled today.     Marty Heck, MD [facility] Office: [phone]

## 2021-08-08 NOTE — Op Note (Addendum)
OPERATIVE NOTE   PROCEDURE: left brachiocephalic arteriovenous fistula placement  PRE-OPERATIVE DIAGNOSIS: end stage renal disease  POST-OPERATIVE DIAGNOSIS: same as above   SURGEON: Marty Heck, MD  ASSISTANT(S): Leontine Locket, PA  ANESTHESIA: MAC  ESTIMATED BLOOD LOSS: Minimal  FINDING(S): Left cephalic vein at the antecubital fossa was of excellent caliber.  Brachial artery was greater than 5 mm and calcified.  Left brachiocephalic AV fistula was created just below the antecubital crease with excellent palpable thrill and a palpable radial pulse at the wrist.  SPECIMEN(S):  none  INDICATIONS:   Anthony Owens is a 66 y.o. male who presents with end stage renal disease and need for permanent hemodialysis access.  The patient is scheduled for left arm arteriovenous fistula placement.  The patient is aware the risks include but are not limited to: bleeding, infection, steal syndrome, nerve damage, ischemic monomelic neuropathy, failure to mature, and need for additional procedures.  The patient is aware of the risks of the procedure and elects to proceed forward.  An assistant was needed for exposure and to expedite the case this included sewing the anastomosis.   DESCRIPTION: After full informed written consent was obtained from the patient, the patient was brought back to the operating room and placed supine upon the operating table.  Prior to induction, the patient received IV antibiotics.   After obtaining adequate anesthesia, the patient was then prepped and draped in the standard fashion for a left arm access procedure.  I turned my attention first to identifying the patient's cephalic vein and brachial artery.  The radial artery was small for wrist fistula.  Using SonoSite guidance, the location of these vessels were marked out on the skin.     At this point, I injected local anesthetic to obtain a field block of the antecubitum.  In total, I injected about 10 mL  of 1% lidocaine without epinephrine.  I made a transverse incision at the level of the antecubitum and dissected through the subcutaneous tissue and fascia to gain exposure of the brachial artery.  This was noted to be 5 mm in diameter externally.  This was dissected out proximally and distally and controlled with vessel loops .  I then dissected out the cephalic vein.  This was noted to be 4 mm in diameter externally.  The distal segment of the vein was ligated with a  2-0 silk, and the vein was transected.  The proximal segment was interrogated with serial dilators.  The vein accepted up to a 5 mm dilator without any difficulty.  I then instilled the heparinized saline into the vein and clamped it.  At this point, I reset my exposure of the brachial artery.  The patient was given 3,000 units IV heparin.  I then placed the artery under tension proximally and distally.  I made an arteriotomy with a #11 blade, and then I extended the arteriotomy with a Potts scissor.  I injected heparinized saline proximal and distal to this arteriotomy.  The vein was then sewn to the artery in an end-to-side configuration with a running stitch of 6-0 Prolene using my assistant.  Prior to completing this anastomosis, I allowed the vein and artery to backbleed.  There was no evidence of clot from any vessels.  I completed the anastomosis in the usual fashion and then released all vessel loops and clamps.    There was a palpable thrill in the venous outflow, and there was a palpable radial pulse.  At  this point, I irrigated out the surgical wound.  There was no further active bleeding.  The subcutaneous tissue was reapproximated with a running stitch of 3-0 Vicryl.  The skin was then reapproximated with a running subcuticular stitch of 4-0 Monocryl.  The skin was then cleaned, dried, and reinforced with Dermabond.  The patient tolerated this procedure well.   COMPLICATIONS: None  CONDITION: Stable  Marty Heck,  MD [facility]: [phone]  08/08/2021, 8:49 AM

## 2021-08-08 NOTE — Discharge Instructions (Signed)
° °  [facility]  Discharge Instructions  AV Fistula or Graft Surgery for Dialysis Access  Please refer to the following instructions for your post-procedure care. Your surgeon or physician assistant will discuss any changes with you.  Activity  You may drive the day following your surgery, if you are comfortable and no longer taking prescription pain medication. Resume full activity as the soreness in your incision resolves.  Bathing/Showering  You may shower after you go home. Keep your incision dry for 48 hours. Do not soak in a bathtub, hot tub, or swim until the incision heals completely. You may not shower if you have a hemodialysis catheter.  Incision Care  Clean your incision with mild soap and water after 48 hours. Pat the area dry with a clean towel. You do not need a bandage unless otherwise instructed. Do not apply any ointments or creams to your incision. You may have skin glue on your incision. Do not peel it off. It will come off on its own in about one week. Your arm may swell a bit after surgery. To reduce swelling use pillows to elevate your arm so it is above your heart. Your doctor will tell you if you need to lightly wrap your arm with an ACE bandage.  Diet  Resume your normal diet. There are not special food restrictions following this procedure. In order to heal from your surgery, it is CRITICAL to get adequate nutrition. Your body requires vitamins, minerals, and protein. Vegetables are the best source of vitamins and minerals. Vegetables also provide the perfect balance of protein. Processed food has little nutritional value, so try to avoid this.  Medications  Resume taking all of your medications. If your incision is causing pain, you may take over-the counter pain relievers such as acetaminophen (Tylenol). If you were prescribed a stronger pain medication, please be aware these medications can cause nausea and constipation. Prevent  nausea by taking the medication with a snack or meal. Avoid constipation by drinking plenty of fluids and eating foods with high amount of fiber, such as fruits, vegetables, and grains.  Do not take Tylenol if you are taking prescription pain medications.  Follow up Your surgeon may want to see you in the office following your access surgery. If so, this will be arranged at the time of your surgery.  Please call us immediately for any of the following conditions:  Increased pain, redness, drainage (pus) from your incision site Fever of 101 degrees or higher Severe or worsening pain at your incision site Hand pain or numbness.  Reduce your risk of vascular disease:  Stop smoking. If you would like help, call [facility] at 1-800-QUIT-NOW 986-675-4122) or [place] at [place] your cholesterol Maintain a desired weight Control your diabetes Keep your blood pressure down  Dialysis  It will take several weeks to several months for your new dialysis access to be ready for use. Your surgeon will determine when it is okay to use it. Your nephrologist will continue to direct your dialysis. You can continue to use your Permcath until your new access is ready for use.   08/08/2021 Anthony Owens A. Knotek [id number] May 12, 1956  Surgeon(s): Marty Heck, MD  Procedure(s): LEFT ARM BRACHIOCEPHALIC ARTERIOVENOUS (AV) FISTULA CREATION  x Do not stick fistula for 12 weeks    If you have any questions, please call the office at [phone].

## 2021-08-08 NOTE — Transfer of Care (Signed)
Immediate Anesthesia Transfer of Care Note  Patient: Anthony Owens. Mare Ferrari  Procedure(s) Performed: LEFT ARM BRACHIOCEPHALIC ARTERIOVENOUS (AV) FISTULA CREATION (Left: Arm Upper)  Patient Location: PACU  Anesthesia Type:MAC  Level of Consciousness: awake, alert  and oriented  Airway & Oxygen Therapy: Patient Spontanous Breathing  Post-op Assessment: Report given to RN and Post -op Vital signs reviewed and stable  Post vital signs: Reviewed  Last Vitals:  Vitals Value Taken Time  BP    Temp    Pulse 79 08/08/21 0900  Resp 15 08/08/21 0900  SpO2 100 % 08/08/21 0900  Vitals shown include unvalidated device data.  Last Pain:  Vitals:   08/08/21 0617  TempSrc:   PainSc: 0-No pain         Complications: No notable events documented.

## 2021-08-09 ENCOUNTER — Encounter (HOSPITAL_COMMUNITY): Payer: Self-pay | Admitting: Vascular Surgery

## 2021-08-19 IMAGING — DX DG BONE SURVEY MET
10 series · 10 of 10 positions shown · non-contrast
Comparison: None.

CLINICAL DATA: Monoclonal gammopathy.  Rule out lytic lesion.

EXAM:
METASTATIC BONE SURVEY

[skull lat]
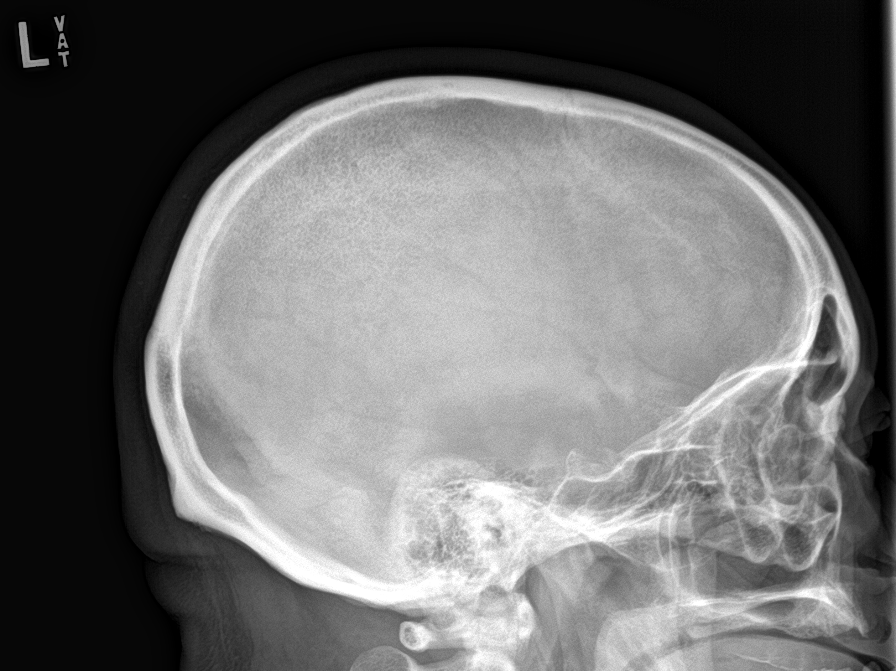

[shoulder ap (1 of 2)]
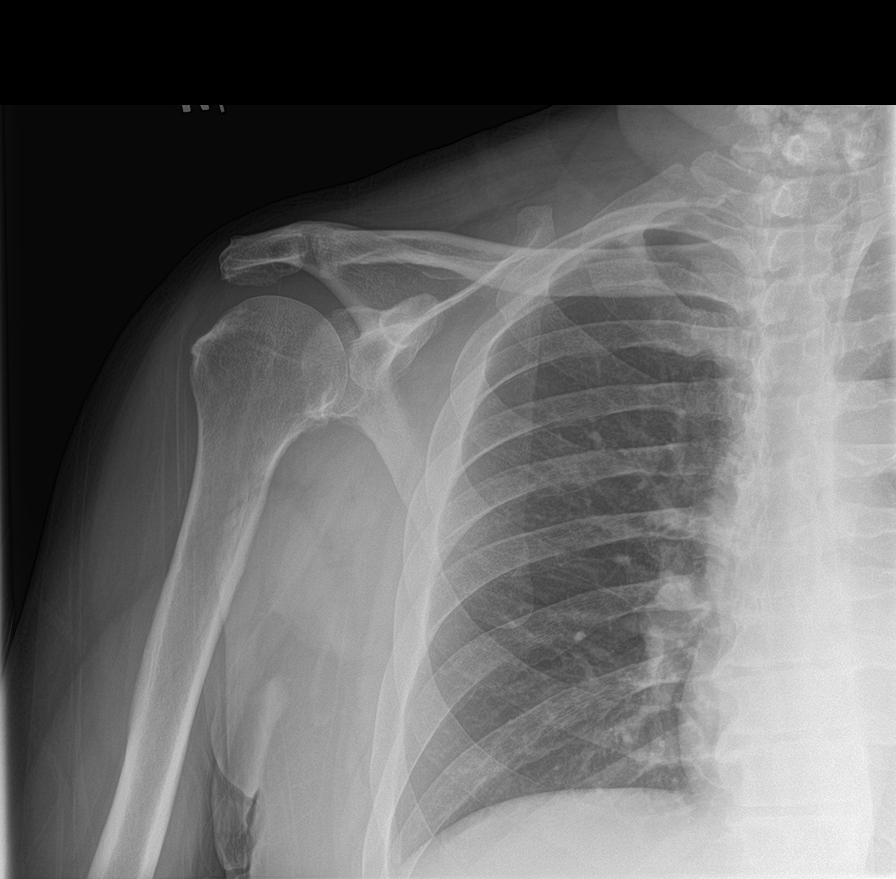

[shoulder ap (2 of 2)]
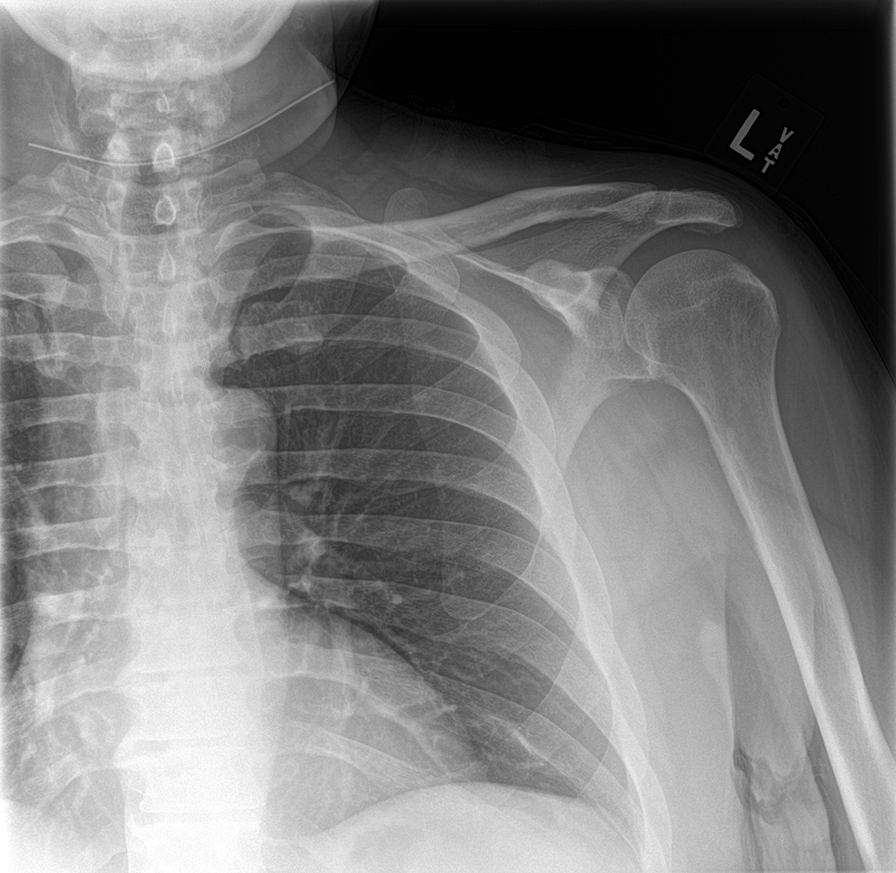

[humerus ap (1 of 2)]
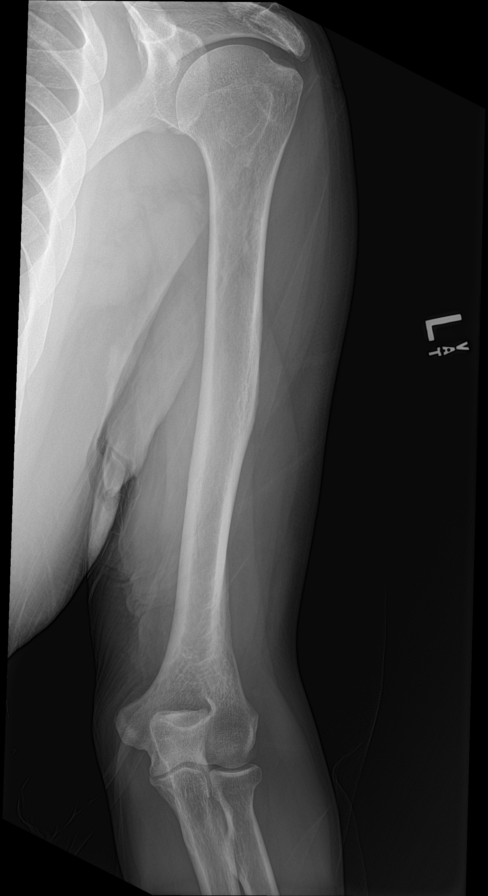

[humerus ap (2 of 2)]
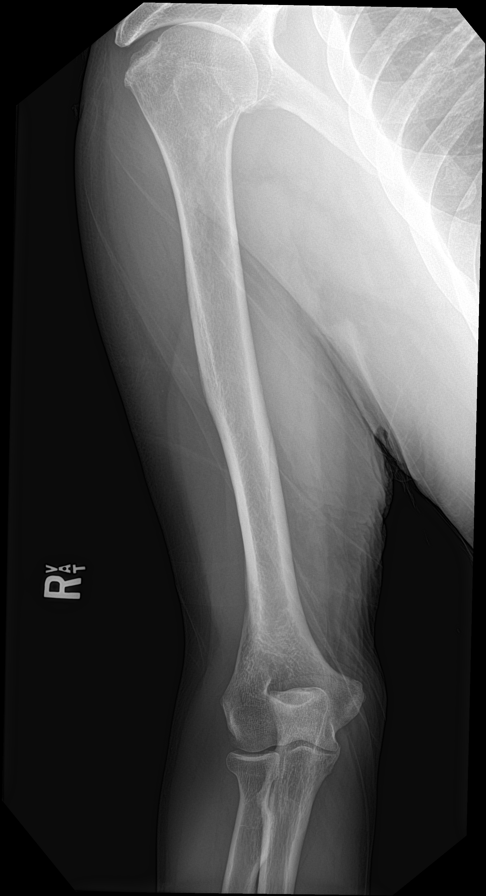

[forearm ap (1 of 2)]
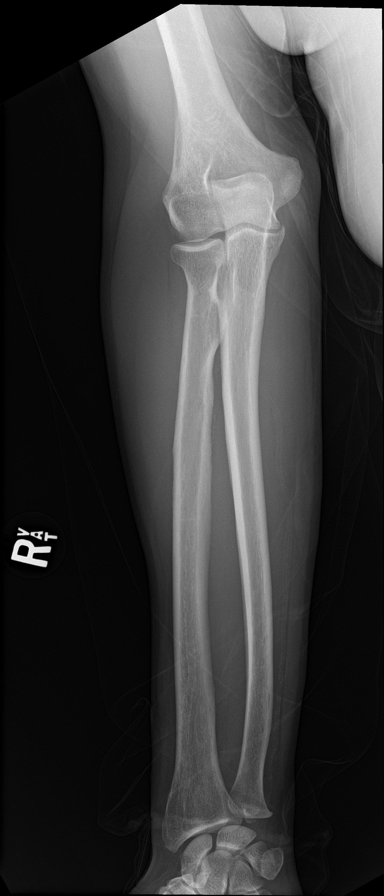

[forearm ap (2 of 2)]
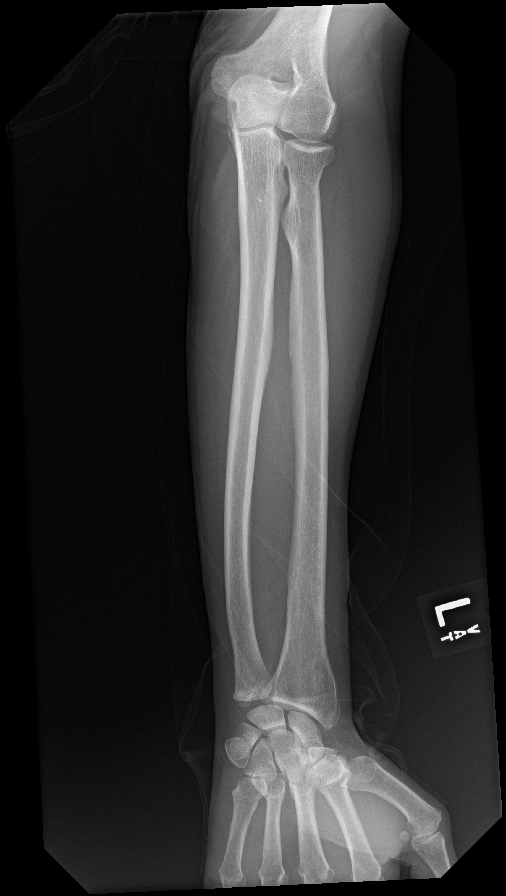

[c-spine ap]
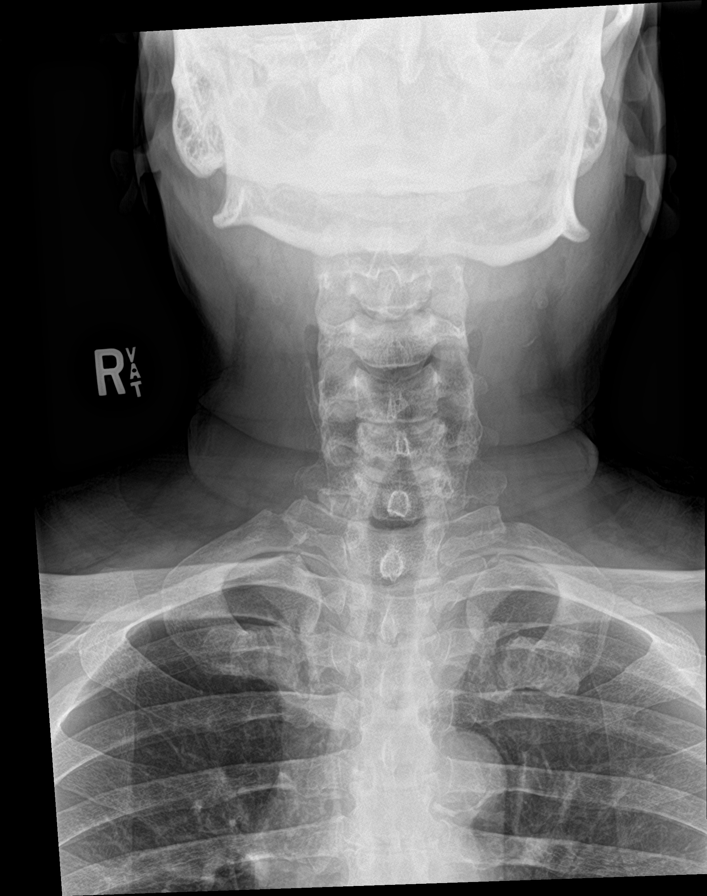

[c-spine lat]
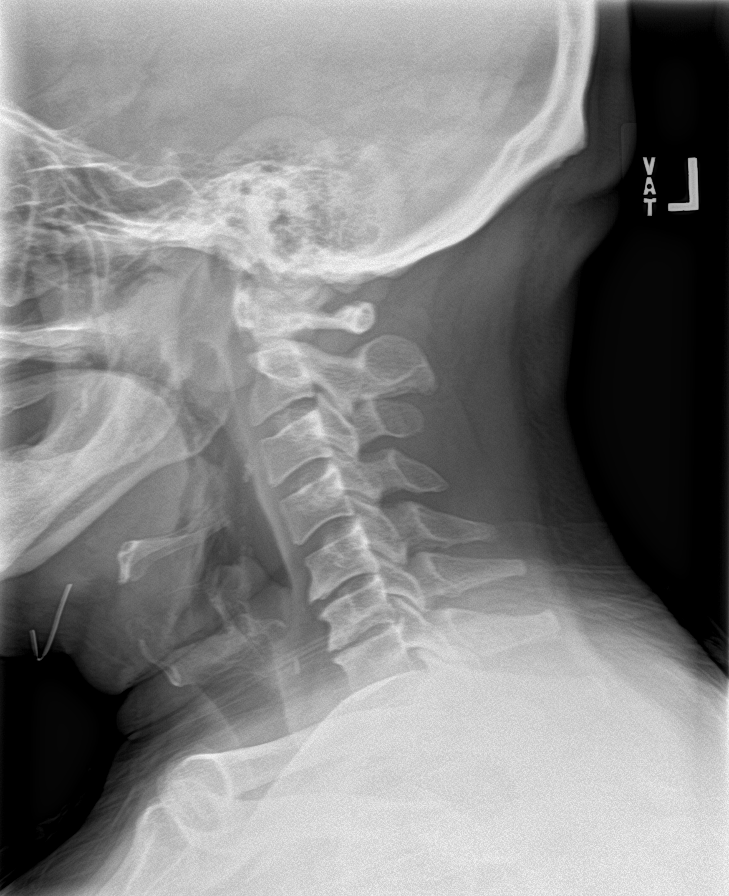

[t-spine ap]
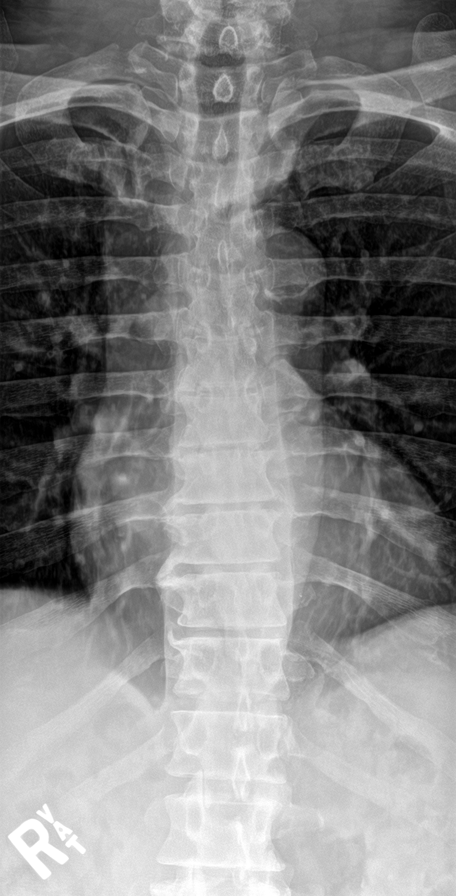

[10 of 10 positions shown; findings below may reference images not displayed]

FINDINGS: No lytic skeletal lesion identified.  No fracture or mass lesion.

Lumbar degenerative changes most prominent L3-4 with disc space
narrowing and spurring left greater than right. Mild scoliosis. Mild
degenerative change in the cervical spine.
IMPRESSION: No evidence of multiple myeloma in the skeleton.

## 2021-08-22 ENCOUNTER — Other Ambulatory Visit: Payer: Self-pay | Admitting: Physician Assistant

## 2021-08-22 DIAGNOSIS — D472 Monoclonal gammopathy: Secondary | ICD-10-CM

## 2021-08-23 ENCOUNTER — Inpatient Hospital Stay: Payer: Managed Care, Other (non HMO)

## 2021-08-23 ENCOUNTER — Inpatient Hospital Stay: Payer: Managed Care, Other (non HMO) | Admitting: Physician Assistant

## 2021-09-23 ENCOUNTER — Other Ambulatory Visit: Payer: Self-pay | Admitting: *Deleted

## 2021-09-23 DIAGNOSIS — N186 End stage renal disease: Secondary | ICD-10-CM

## 2021-09-23 NOTE — Progress Notes (Signed)
?  POST OPERATIVE OFFICE NOTE ? ? ? ?CC:  F/u for surgery ? ?HPI:  This is a 66 y.o. male who is s/p left BC AVF on 08/08/2021 by Dr. Carlis Abbott  ? ?Pt states he does not have pain/numbness in left hand.   ? ?The pt is on dialysis T/T/S. ? ?Pt is right handed and he has not had previous access in the past.  He does have a right IJ TDC that was placed at [facility]. ? ? ?No Known Allergies ? ?Current Outpatient Medications  ?Medication Sig Dispense Refill  ? albuterol (VENTOLIN HFA) 108 (90 Base) MCG/ACT inhaler Inhale 1-2 puffs into the lungs every 6 (six) hours as needed for wheezing or shortness of breath.    ? amLODipine (NORVASC) 10 MG tablet Take 10 mg by mouth in the morning.    ? aspirin EC 81 MG tablet Take 81 mg by mouth daily. Swallow whole.    ? HYDROcodone-acetaminophen (NORCO) 5-325 MG tablet Take 1 tablet by mouth every 6 (six) hours as needed for moderate pain. 8 tablet 0  ? sodium bicarbonate 650 MG tablet Take 650 mg by mouth in the morning and at bedtime.    ? ?No current facility-administered medications for this visit.  ? ? ? ROS:  See HPI ? ?Physical Exam: ? ?Today's Vitals  ? 09/27/21 1428  ?BP: 115/72  ?Pulse: 88  ?Resp: 18  ?Temp: 98.2 ?F (36.8 ?C)  ?TempSrc: Temporal  ?SpO2: 99%  ?Weight: 226 lb 11.2 oz (102.8 kg)  ?Height: '5\' 11"'$  (1.803 m)  ?PainSc: 0-No pain  ? ?Body mass index is 31.62 kg/m?. ? ? ?Incision:  well healed ?Extremities:   ?There is a palpable left radial pulse.   ?Motor and sensory are in tact.   ?There is a thrill/bruit present.  ?The fistula/graft is easily palpable ? ? ?Dialysis Duplex on 09/23/2021: ?Diameter:  0.54-0.70cm ?Depth:  0.22-1.09cm  (shoulder)  ? ? ?Assessment/Plan:  This is a 66 y.o. male who is s/p: ?left BC AVF on 08/08/2021 by Dr. Carlis Abbott  ? ?-the pt does not have evidence of steal. ?-the fistula is maturing nicely and can be used 11/05/2021. ?-If pt has a tunneled dialysis catheter and the access has been used successfully to the satisfaction of the dialysis  center, the tunneled catheter can be scheduled to be removed at their discretion.   ?-discussed with pt that access does not last forever and will need intervention or even new access at some point.  ?-the pt will follow up as needed ? ? ?Leontine Locket, PAC ?[facility] ?[phone] ? ?Clinic MD:  Carlis Abbott ? ?

## 2021-09-27 ENCOUNTER — Ambulatory Visit (HOSPITAL_COMMUNITY)
Admission: RE | Admit: 2021-09-27 | Discharge: 2021-09-27 | Disposition: A | Discharge status: Home / Self Care | Payer: Managed Care, Other (non HMO) | Source: Ambulatory Visit | Attending: Vascular Surgery | Admitting: Vascular Surgery

## 2021-09-27 ENCOUNTER — Encounter: Payer: Self-pay | Admitting: Physician Assistant

## 2021-09-27 ENCOUNTER — Ambulatory Visit (INDEPENDENT_AMBULATORY_CARE_PROVIDER_SITE_OTHER): Payer: Managed Care, Other (non HMO) | Admitting: Physician Assistant

## 2021-09-27 VITALS — BP 115/72 | HR 88 | Temp 98.2°F | Resp 18 | Ht 71.0 in | Wt 226.7 lb

## 2021-09-27 DIAGNOSIS — N186 End stage renal disease: Secondary | ICD-10-CM | POA: Diagnosis not present

## 2021-09-27 DIAGNOSIS — Z992 Dependence on renal dialysis: Secondary | ICD-10-CM

## 2022-02-28 ENCOUNTER — Telehealth: Payer: Self-pay | Admitting: Hematology and Oncology

## 2022-02-28 NOTE — Telephone Encounter (Signed)
Per 9/5 secure chat called and left message about appointment

## 2022-03-14 ENCOUNTER — Inpatient Hospital Stay: Payer: Managed Care, Other (non HMO)

## 2022-03-14 ENCOUNTER — Telehealth: Payer: Self-pay | Admitting: Hematology and Oncology

## 2022-03-14 ENCOUNTER — Inpatient Hospital Stay: Payer: Managed Care, Other (non HMO) | Admitting: Hematology and Oncology

## 2022-03-14 NOTE — Telephone Encounter (Signed)
Scheduled per 9/19 in basket, pt has been called and confirmed

## 2022-04-03 ENCOUNTER — Inpatient Hospital Stay: Payer: Managed Care, Other (non HMO)

## 2022-04-03 ENCOUNTER — Inpatient Hospital Stay: Payer: Managed Care, Other (non HMO) | Admitting: Hematology and Oncology

## 2022-09-11 ENCOUNTER — Ambulatory Visit: Payer: Managed Care, Other (non HMO) | Admitting: Student

## 2023-10-24 DIAGNOSIS — Z992 Dependence on renal dialysis: Secondary | ICD-10-CM | POA: Diagnosis not present

## 2023-10-24 DIAGNOSIS — N186 End stage renal disease: Secondary | ICD-10-CM | POA: Diagnosis not present

## 2023-10-24 DIAGNOSIS — E1122 Type 2 diabetes mellitus with diabetic chronic kidney disease: Secondary | ICD-10-CM | POA: Diagnosis not present

## 2023-10-25 DIAGNOSIS — N186 End stage renal disease: Secondary | ICD-10-CM | POA: Diagnosis not present

## 2023-10-25 DIAGNOSIS — Z992 Dependence on renal dialysis: Secondary | ICD-10-CM | POA: Diagnosis not present

## 2023-10-25 DIAGNOSIS — N2581 Secondary hyperparathyroidism of renal origin: Secondary | ICD-10-CM | POA: Diagnosis not present

## 2023-10-27 DIAGNOSIS — N186 End stage renal disease: Secondary | ICD-10-CM | POA: Diagnosis not present

## 2023-10-27 DIAGNOSIS — N2581 Secondary hyperparathyroidism of renal origin: Secondary | ICD-10-CM | POA: Diagnosis not present

## 2023-10-27 DIAGNOSIS — Z992 Dependence on renal dialysis: Secondary | ICD-10-CM | POA: Diagnosis not present

## 2023-10-30 DIAGNOSIS — Z992 Dependence on renal dialysis: Secondary | ICD-10-CM | POA: Diagnosis not present

## 2023-10-30 DIAGNOSIS — N2581 Secondary hyperparathyroidism of renal origin: Secondary | ICD-10-CM | POA: Diagnosis not present

## 2023-10-30 DIAGNOSIS — N186 End stage renal disease: Secondary | ICD-10-CM | POA: Diagnosis not present

## 2023-11-01 DIAGNOSIS — N2581 Secondary hyperparathyroidism of renal origin: Secondary | ICD-10-CM | POA: Diagnosis not present

## 2023-11-01 DIAGNOSIS — N186 End stage renal disease: Secondary | ICD-10-CM | POA: Diagnosis not present

## 2023-11-01 DIAGNOSIS — Z992 Dependence on renal dialysis: Secondary | ICD-10-CM | POA: Diagnosis not present

## 2023-11-03 DIAGNOSIS — N186 End stage renal disease: Secondary | ICD-10-CM | POA: Diagnosis not present

## 2023-11-03 DIAGNOSIS — Z992 Dependence on renal dialysis: Secondary | ICD-10-CM | POA: Diagnosis not present

## 2023-11-03 DIAGNOSIS — N2581 Secondary hyperparathyroidism of renal origin: Secondary | ICD-10-CM | POA: Diagnosis not present

## 2023-11-06 DIAGNOSIS — N186 End stage renal disease: Secondary | ICD-10-CM | POA: Diagnosis not present

## 2023-11-06 DIAGNOSIS — Z992 Dependence on renal dialysis: Secondary | ICD-10-CM | POA: Diagnosis not present

## 2023-11-06 DIAGNOSIS — N2581 Secondary hyperparathyroidism of renal origin: Secondary | ICD-10-CM | POA: Diagnosis not present

## 2023-11-07 DIAGNOSIS — H25813 Combined forms of age-related cataract, bilateral: Secondary | ICD-10-CM | POA: Diagnosis not present

## 2023-11-07 DIAGNOSIS — H35033 Hypertensive retinopathy, bilateral: Secondary | ICD-10-CM | POA: Diagnosis not present

## 2023-11-07 DIAGNOSIS — E119 Type 2 diabetes mellitus without complications: Secondary | ICD-10-CM | POA: Diagnosis not present

## 2023-11-07 DIAGNOSIS — H40013 Open angle with borderline findings, low risk, bilateral: Secondary | ICD-10-CM | POA: Diagnosis not present

## 2023-11-08 DIAGNOSIS — Z992 Dependence on renal dialysis: Secondary | ICD-10-CM | POA: Diagnosis not present

## 2023-11-08 DIAGNOSIS — N2581 Secondary hyperparathyroidism of renal origin: Secondary | ICD-10-CM | POA: Diagnosis not present

## 2023-11-08 DIAGNOSIS — N186 End stage renal disease: Secondary | ICD-10-CM | POA: Diagnosis not present

## 2023-11-10 DIAGNOSIS — N2581 Secondary hyperparathyroidism of renal origin: Secondary | ICD-10-CM | POA: Diagnosis not present

## 2023-11-10 DIAGNOSIS — Z992 Dependence on renal dialysis: Secondary | ICD-10-CM | POA: Diagnosis not present

## 2023-11-10 DIAGNOSIS — N186 End stage renal disease: Secondary | ICD-10-CM | POA: Diagnosis not present

## 2023-11-13 DIAGNOSIS — N186 End stage renal disease: Secondary | ICD-10-CM | POA: Diagnosis not present

## 2023-11-13 DIAGNOSIS — N2581 Secondary hyperparathyroidism of renal origin: Secondary | ICD-10-CM | POA: Diagnosis not present

## 2023-11-13 DIAGNOSIS — Z992 Dependence on renal dialysis: Secondary | ICD-10-CM | POA: Diagnosis not present

## 2023-11-15 DIAGNOSIS — Z992 Dependence on renal dialysis: Secondary | ICD-10-CM | POA: Diagnosis not present

## 2023-11-15 DIAGNOSIS — N186 End stage renal disease: Secondary | ICD-10-CM | POA: Diagnosis not present

## 2023-11-15 DIAGNOSIS — N2581 Secondary hyperparathyroidism of renal origin: Secondary | ICD-10-CM | POA: Diagnosis not present

## 2023-11-17 DIAGNOSIS — N186 End stage renal disease: Secondary | ICD-10-CM | POA: Diagnosis not present

## 2023-11-17 DIAGNOSIS — N2581 Secondary hyperparathyroidism of renal origin: Secondary | ICD-10-CM | POA: Diagnosis not present

## 2023-11-17 DIAGNOSIS — Z992 Dependence on renal dialysis: Secondary | ICD-10-CM | POA: Diagnosis not present

## 2023-11-20 DIAGNOSIS — Z992 Dependence on renal dialysis: Secondary | ICD-10-CM | POA: Diagnosis not present

## 2023-11-20 DIAGNOSIS — N186 End stage renal disease: Secondary | ICD-10-CM | POA: Diagnosis not present

## 2023-11-20 DIAGNOSIS — N2581 Secondary hyperparathyroidism of renal origin: Secondary | ICD-10-CM | POA: Diagnosis not present

## 2023-11-22 DIAGNOSIS — N2581 Secondary hyperparathyroidism of renal origin: Secondary | ICD-10-CM | POA: Diagnosis not present

## 2023-11-22 DIAGNOSIS — Z992 Dependence on renal dialysis: Secondary | ICD-10-CM | POA: Diagnosis not present

## 2023-11-22 DIAGNOSIS — N186 End stage renal disease: Secondary | ICD-10-CM | POA: Diagnosis not present

## 2023-11-23 DIAGNOSIS — N186 End stage renal disease: Secondary | ICD-10-CM | POA: Diagnosis not present

## 2023-11-23 DIAGNOSIS — Z992 Dependence on renal dialysis: Secondary | ICD-10-CM | POA: Diagnosis not present

## 2023-11-23 DIAGNOSIS — N2581 Secondary hyperparathyroidism of renal origin: Secondary | ICD-10-CM | POA: Diagnosis not present

## 2023-11-24 DIAGNOSIS — E1122 Type 2 diabetes mellitus with diabetic chronic kidney disease: Secondary | ICD-10-CM | POA: Diagnosis not present

## 2023-11-24 DIAGNOSIS — N2581 Secondary hyperparathyroidism of renal origin: Secondary | ICD-10-CM | POA: Diagnosis not present

## 2023-11-24 DIAGNOSIS — N186 End stage renal disease: Secondary | ICD-10-CM | POA: Diagnosis not present

## 2023-11-24 DIAGNOSIS — Z992 Dependence on renal dialysis: Secondary | ICD-10-CM | POA: Diagnosis not present

## 2023-11-27 DIAGNOSIS — N186 End stage renal disease: Secondary | ICD-10-CM | POA: Diagnosis not present

## 2023-11-27 DIAGNOSIS — N2581 Secondary hyperparathyroidism of renal origin: Secondary | ICD-10-CM | POA: Diagnosis not present

## 2023-11-27 DIAGNOSIS — Z992 Dependence on renal dialysis: Secondary | ICD-10-CM | POA: Diagnosis not present

## 2023-11-29 DIAGNOSIS — Z992 Dependence on renal dialysis: Secondary | ICD-10-CM | POA: Diagnosis not present

## 2023-11-29 DIAGNOSIS — N2581 Secondary hyperparathyroidism of renal origin: Secondary | ICD-10-CM | POA: Diagnosis not present

## 2023-11-29 DIAGNOSIS — N186 End stage renal disease: Secondary | ICD-10-CM | POA: Diagnosis not present

## 2023-12-01 DIAGNOSIS — N186 End stage renal disease: Secondary | ICD-10-CM | POA: Diagnosis not present

## 2023-12-01 DIAGNOSIS — Z992 Dependence on renal dialysis: Secondary | ICD-10-CM | POA: Diagnosis not present

## 2023-12-01 DIAGNOSIS — N2581 Secondary hyperparathyroidism of renal origin: Secondary | ICD-10-CM | POA: Diagnosis not present

## 2023-12-04 DIAGNOSIS — N186 End stage renal disease: Secondary | ICD-10-CM | POA: Diagnosis not present

## 2023-12-04 DIAGNOSIS — Z992 Dependence on renal dialysis: Secondary | ICD-10-CM | POA: Diagnosis not present

## 2023-12-04 DIAGNOSIS — N2581 Secondary hyperparathyroidism of renal origin: Secondary | ICD-10-CM | POA: Diagnosis not present

## 2023-12-06 DIAGNOSIS — Z992 Dependence on renal dialysis: Secondary | ICD-10-CM | POA: Diagnosis not present

## 2023-12-06 DIAGNOSIS — N186 End stage renal disease: Secondary | ICD-10-CM | POA: Diagnosis not present

## 2023-12-06 DIAGNOSIS — N2581 Secondary hyperparathyroidism of renal origin: Secondary | ICD-10-CM | POA: Diagnosis not present

## 2023-12-08 DIAGNOSIS — N186 End stage renal disease: Secondary | ICD-10-CM | POA: Diagnosis not present

## 2023-12-08 DIAGNOSIS — N2581 Secondary hyperparathyroidism of renal origin: Secondary | ICD-10-CM | POA: Diagnosis not present

## 2023-12-08 DIAGNOSIS — Z992 Dependence on renal dialysis: Secondary | ICD-10-CM | POA: Diagnosis not present

## 2023-12-11 DIAGNOSIS — N2581 Secondary hyperparathyroidism of renal origin: Secondary | ICD-10-CM | POA: Diagnosis not present

## 2023-12-11 DIAGNOSIS — N186 End stage renal disease: Secondary | ICD-10-CM | POA: Diagnosis not present

## 2023-12-11 DIAGNOSIS — Z992 Dependence on renal dialysis: Secondary | ICD-10-CM | POA: Diagnosis not present

## 2023-12-13 DIAGNOSIS — N2581 Secondary hyperparathyroidism of renal origin: Secondary | ICD-10-CM | POA: Diagnosis not present

## 2023-12-13 DIAGNOSIS — N186 End stage renal disease: Secondary | ICD-10-CM | POA: Diagnosis not present

## 2023-12-13 DIAGNOSIS — Z992 Dependence on renal dialysis: Secondary | ICD-10-CM | POA: Diagnosis not present

## 2023-12-15 DIAGNOSIS — N186 End stage renal disease: Secondary | ICD-10-CM | POA: Diagnosis not present

## 2023-12-15 DIAGNOSIS — N2581 Secondary hyperparathyroidism of renal origin: Secondary | ICD-10-CM | POA: Diagnosis not present

## 2023-12-15 DIAGNOSIS — Z992 Dependence on renal dialysis: Secondary | ICD-10-CM | POA: Diagnosis not present

## 2023-12-18 DIAGNOSIS — N186 End stage renal disease: Secondary | ICD-10-CM | POA: Diagnosis not present

## 2023-12-18 DIAGNOSIS — N2581 Secondary hyperparathyroidism of renal origin: Secondary | ICD-10-CM | POA: Diagnosis not present

## 2023-12-18 DIAGNOSIS — Z992 Dependence on renal dialysis: Secondary | ICD-10-CM | POA: Diagnosis not present

## 2023-12-20 DIAGNOSIS — N186 End stage renal disease: Secondary | ICD-10-CM | POA: Diagnosis not present

## 2023-12-20 DIAGNOSIS — N2581 Secondary hyperparathyroidism of renal origin: Secondary | ICD-10-CM | POA: Diagnosis not present

## 2023-12-20 DIAGNOSIS — Z992 Dependence on renal dialysis: Secondary | ICD-10-CM | POA: Diagnosis not present

## 2023-12-22 DIAGNOSIS — N2581 Secondary hyperparathyroidism of renal origin: Secondary | ICD-10-CM | POA: Diagnosis not present

## 2023-12-22 DIAGNOSIS — Z992 Dependence on renal dialysis: Secondary | ICD-10-CM | POA: Diagnosis not present

## 2023-12-22 DIAGNOSIS — N186 End stage renal disease: Secondary | ICD-10-CM | POA: Diagnosis not present

## 2023-12-24 DIAGNOSIS — E1122 Type 2 diabetes mellitus with diabetic chronic kidney disease: Secondary | ICD-10-CM | POA: Diagnosis not present

## 2023-12-24 DIAGNOSIS — N186 End stage renal disease: Secondary | ICD-10-CM | POA: Diagnosis not present

## 2023-12-24 DIAGNOSIS — Z992 Dependence on renal dialysis: Secondary | ICD-10-CM | POA: Diagnosis not present

## 2023-12-25 DIAGNOSIS — N2581 Secondary hyperparathyroidism of renal origin: Secondary | ICD-10-CM | POA: Diagnosis not present

## 2023-12-25 DIAGNOSIS — N186 End stage renal disease: Secondary | ICD-10-CM | POA: Diagnosis not present

## 2023-12-25 DIAGNOSIS — Z992 Dependence on renal dialysis: Secondary | ICD-10-CM | POA: Diagnosis not present

## 2023-12-27 DIAGNOSIS — N2581 Secondary hyperparathyroidism of renal origin: Secondary | ICD-10-CM | POA: Diagnosis not present

## 2023-12-27 DIAGNOSIS — N186 End stage renal disease: Secondary | ICD-10-CM | POA: Diagnosis not present

## 2023-12-27 DIAGNOSIS — Z992 Dependence on renal dialysis: Secondary | ICD-10-CM | POA: Diagnosis not present

## 2023-12-29 DIAGNOSIS — N2581 Secondary hyperparathyroidism of renal origin: Secondary | ICD-10-CM | POA: Diagnosis not present

## 2023-12-29 DIAGNOSIS — N186 End stage renal disease: Secondary | ICD-10-CM | POA: Diagnosis not present

## 2023-12-29 DIAGNOSIS — Z992 Dependence on renal dialysis: Secondary | ICD-10-CM | POA: Diagnosis not present

## 2024-01-01 DIAGNOSIS — N186 End stage renal disease: Secondary | ICD-10-CM | POA: Diagnosis not present

## 2024-01-01 DIAGNOSIS — Z992 Dependence on renal dialysis: Secondary | ICD-10-CM | POA: Diagnosis not present

## 2024-01-01 DIAGNOSIS — N2581 Secondary hyperparathyroidism of renal origin: Secondary | ICD-10-CM | POA: Diagnosis not present

## 2024-01-03 DIAGNOSIS — N2581 Secondary hyperparathyroidism of renal origin: Secondary | ICD-10-CM | POA: Diagnosis not present

## 2024-01-03 DIAGNOSIS — Z992 Dependence on renal dialysis: Secondary | ICD-10-CM | POA: Diagnosis not present

## 2024-01-03 DIAGNOSIS — N186 End stage renal disease: Secondary | ICD-10-CM | POA: Diagnosis not present

## 2024-01-05 DIAGNOSIS — N2581 Secondary hyperparathyroidism of renal origin: Secondary | ICD-10-CM | POA: Diagnosis not present

## 2024-01-05 DIAGNOSIS — N186 End stage renal disease: Secondary | ICD-10-CM | POA: Diagnosis not present

## 2024-01-05 DIAGNOSIS — Z992 Dependence on renal dialysis: Secondary | ICD-10-CM | POA: Diagnosis not present

## 2024-01-08 DIAGNOSIS — N2581 Secondary hyperparathyroidism of renal origin: Secondary | ICD-10-CM | POA: Diagnosis not present

## 2024-01-08 DIAGNOSIS — Z992 Dependence on renal dialysis: Secondary | ICD-10-CM | POA: Diagnosis not present

## 2024-01-08 DIAGNOSIS — N186 End stage renal disease: Secondary | ICD-10-CM | POA: Diagnosis not present

## 2024-01-09 DIAGNOSIS — H40013 Open angle with borderline findings, low risk, bilateral: Secondary | ICD-10-CM | POA: Diagnosis not present

## 2024-01-10 DIAGNOSIS — N2581 Secondary hyperparathyroidism of renal origin: Secondary | ICD-10-CM | POA: Diagnosis not present

## 2024-01-10 DIAGNOSIS — Z992 Dependence on renal dialysis: Secondary | ICD-10-CM | POA: Diagnosis not present

## 2024-01-10 DIAGNOSIS — N186 End stage renal disease: Secondary | ICD-10-CM | POA: Diagnosis not present

## 2024-01-12 DIAGNOSIS — N2581 Secondary hyperparathyroidism of renal origin: Secondary | ICD-10-CM | POA: Diagnosis not present

## 2024-01-12 DIAGNOSIS — N186 End stage renal disease: Secondary | ICD-10-CM | POA: Diagnosis not present

## 2024-01-12 DIAGNOSIS — Z992 Dependence on renal dialysis: Secondary | ICD-10-CM | POA: Diagnosis not present

## 2024-01-15 DIAGNOSIS — Z992 Dependence on renal dialysis: Secondary | ICD-10-CM | POA: Diagnosis not present

## 2024-01-15 DIAGNOSIS — N186 End stage renal disease: Secondary | ICD-10-CM | POA: Diagnosis not present

## 2024-01-15 DIAGNOSIS — N2581 Secondary hyperparathyroidism of renal origin: Secondary | ICD-10-CM | POA: Diagnosis not present

## 2024-01-17 DIAGNOSIS — N186 End stage renal disease: Secondary | ICD-10-CM | POA: Diagnosis not present

## 2024-01-17 DIAGNOSIS — N2581 Secondary hyperparathyroidism of renal origin: Secondary | ICD-10-CM | POA: Diagnosis not present

## 2024-01-17 DIAGNOSIS — Z992 Dependence on renal dialysis: Secondary | ICD-10-CM | POA: Diagnosis not present

## 2024-01-19 DIAGNOSIS — N186 End stage renal disease: Secondary | ICD-10-CM | POA: Diagnosis not present

## 2024-01-19 DIAGNOSIS — Z992 Dependence on renal dialysis: Secondary | ICD-10-CM | POA: Diagnosis not present

## 2024-01-19 DIAGNOSIS — N2581 Secondary hyperparathyroidism of renal origin: Secondary | ICD-10-CM | POA: Diagnosis not present

## 2024-01-22 DIAGNOSIS — N186 End stage renal disease: Secondary | ICD-10-CM | POA: Diagnosis not present

## 2024-01-22 DIAGNOSIS — N2581 Secondary hyperparathyroidism of renal origin: Secondary | ICD-10-CM | POA: Diagnosis not present

## 2024-01-22 DIAGNOSIS — Z992 Dependence on renal dialysis: Secondary | ICD-10-CM | POA: Diagnosis not present

## 2024-01-24 DIAGNOSIS — E1122 Type 2 diabetes mellitus with diabetic chronic kidney disease: Secondary | ICD-10-CM | POA: Diagnosis not present

## 2024-01-24 DIAGNOSIS — N186 End stage renal disease: Secondary | ICD-10-CM | POA: Diagnosis not present

## 2024-01-24 DIAGNOSIS — N2581 Secondary hyperparathyroidism of renal origin: Secondary | ICD-10-CM | POA: Diagnosis not present

## 2024-01-24 DIAGNOSIS — Z992 Dependence on renal dialysis: Secondary | ICD-10-CM | POA: Diagnosis not present

## 2024-01-26 DIAGNOSIS — Z992 Dependence on renal dialysis: Secondary | ICD-10-CM | POA: Diagnosis not present

## 2024-01-26 DIAGNOSIS — N2581 Secondary hyperparathyroidism of renal origin: Secondary | ICD-10-CM | POA: Diagnosis not present

## 2024-01-26 DIAGNOSIS — N186 End stage renal disease: Secondary | ICD-10-CM | POA: Diagnosis not present

## 2024-01-29 DIAGNOSIS — N186 End stage renal disease: Secondary | ICD-10-CM | POA: Diagnosis not present

## 2024-01-29 DIAGNOSIS — Z992 Dependence on renal dialysis: Secondary | ICD-10-CM | POA: Diagnosis not present

## 2024-01-29 DIAGNOSIS — N2581 Secondary hyperparathyroidism of renal origin: Secondary | ICD-10-CM | POA: Diagnosis not present

## 2024-01-31 DIAGNOSIS — N186 End stage renal disease: Secondary | ICD-10-CM | POA: Diagnosis not present

## 2024-01-31 DIAGNOSIS — N2581 Secondary hyperparathyroidism of renal origin: Secondary | ICD-10-CM | POA: Diagnosis not present

## 2024-01-31 DIAGNOSIS — Z992 Dependence on renal dialysis: Secondary | ICD-10-CM | POA: Diagnosis not present

## 2024-02-02 DIAGNOSIS — N2581 Secondary hyperparathyroidism of renal origin: Secondary | ICD-10-CM | POA: Diagnosis not present

## 2024-02-02 DIAGNOSIS — N186 End stage renal disease: Secondary | ICD-10-CM | POA: Diagnosis not present

## 2024-02-02 DIAGNOSIS — Z992 Dependence on renal dialysis: Secondary | ICD-10-CM | POA: Diagnosis not present

## 2024-02-05 DIAGNOSIS — N186 End stage renal disease: Secondary | ICD-10-CM | POA: Diagnosis not present

## 2024-02-05 DIAGNOSIS — Z992 Dependence on renal dialysis: Secondary | ICD-10-CM | POA: Diagnosis not present

## 2024-02-05 DIAGNOSIS — N2581 Secondary hyperparathyroidism of renal origin: Secondary | ICD-10-CM | POA: Diagnosis not present

## 2024-02-09 DIAGNOSIS — N2581 Secondary hyperparathyroidism of renal origin: Secondary | ICD-10-CM | POA: Diagnosis not present

## 2024-02-09 DIAGNOSIS — N186 End stage renal disease: Secondary | ICD-10-CM | POA: Diagnosis not present

## 2024-02-09 DIAGNOSIS — Z992 Dependence on renal dialysis: Secondary | ICD-10-CM | POA: Diagnosis not present

## 2024-02-12 ENCOUNTER — Other Ambulatory Visit: Payer: Self-pay | Admitting: Nephrology

## 2024-02-12 ENCOUNTER — Encounter: Payer: Self-pay | Admitting: Nephrology

## 2024-02-12 DIAGNOSIS — Z992 Dependence on renal dialysis: Secondary | ICD-10-CM | POA: Diagnosis not present

## 2024-02-12 DIAGNOSIS — N186 End stage renal disease: Secondary | ICD-10-CM | POA: Diagnosis not present

## 2024-02-12 DIAGNOSIS — D3500 Benign neoplasm of unspecified adrenal gland: Secondary | ICD-10-CM

## 2024-02-12 DIAGNOSIS — N2581 Secondary hyperparathyroidism of renal origin: Secondary | ICD-10-CM | POA: Diagnosis not present

## 2024-02-14 DIAGNOSIS — N2581 Secondary hyperparathyroidism of renal origin: Secondary | ICD-10-CM | POA: Diagnosis not present

## 2024-02-14 DIAGNOSIS — Z992 Dependence on renal dialysis: Secondary | ICD-10-CM | POA: Diagnosis not present

## 2024-02-14 DIAGNOSIS — N186 End stage renal disease: Secondary | ICD-10-CM | POA: Diagnosis not present

## 2024-02-16 DIAGNOSIS — Z992 Dependence on renal dialysis: Secondary | ICD-10-CM | POA: Diagnosis not present

## 2024-02-16 DIAGNOSIS — N186 End stage renal disease: Secondary | ICD-10-CM | POA: Diagnosis not present

## 2024-02-16 DIAGNOSIS — N2581 Secondary hyperparathyroidism of renal origin: Secondary | ICD-10-CM | POA: Diagnosis not present

## 2024-02-18 DIAGNOSIS — N2581 Secondary hyperparathyroidism of renal origin: Secondary | ICD-10-CM | POA: Diagnosis not present

## 2024-02-18 DIAGNOSIS — N186 End stage renal disease: Secondary | ICD-10-CM | POA: Diagnosis not present

## 2024-02-18 DIAGNOSIS — Z992 Dependence on renal dialysis: Secondary | ICD-10-CM | POA: Diagnosis not present

## 2024-02-21 DIAGNOSIS — Z992 Dependence on renal dialysis: Secondary | ICD-10-CM | POA: Diagnosis not present

## 2024-02-21 DIAGNOSIS — N186 End stage renal disease: Secondary | ICD-10-CM | POA: Diagnosis not present

## 2024-02-21 DIAGNOSIS — N2581 Secondary hyperparathyroidism of renal origin: Secondary | ICD-10-CM | POA: Diagnosis not present

## 2024-02-23 DIAGNOSIS — N2581 Secondary hyperparathyroidism of renal origin: Secondary | ICD-10-CM | POA: Diagnosis not present

## 2024-02-23 DIAGNOSIS — Z992 Dependence on renal dialysis: Secondary | ICD-10-CM | POA: Diagnosis not present

## 2024-02-23 DIAGNOSIS — N186 End stage renal disease: Secondary | ICD-10-CM | POA: Diagnosis not present

## 2024-02-24 DIAGNOSIS — Z992 Dependence on renal dialysis: Secondary | ICD-10-CM | POA: Diagnosis not present

## 2024-02-24 DIAGNOSIS — N186 End stage renal disease: Secondary | ICD-10-CM | POA: Diagnosis not present

## 2024-02-24 DIAGNOSIS — E1122 Type 2 diabetes mellitus with diabetic chronic kidney disease: Secondary | ICD-10-CM | POA: Diagnosis not present

## 2024-05-01 ENCOUNTER — Encounter: Payer: Self-pay | Admitting: Nephrology

## 2024-05-05 ENCOUNTER — Inpatient Hospital Stay: Admission: RE | Admit: 2024-05-05 | Source: Ambulatory Visit

## 2024-06-20 ENCOUNTER — Encounter (HOSPITAL_COMMUNITY): Payer: Self-pay

## 2024-06-27 ENCOUNTER — Ambulatory Visit (HOSPITAL_COMMUNITY)
Admission: RE | Admit: 2024-06-27 | Discharge: 2024-06-27 | Disposition: A | Discharge status: Home / Self Care | Payer: Self-pay | Attending: Vascular Surgery | Admitting: Vascular Surgery

## 2024-06-27 ENCOUNTER — Other Ambulatory Visit: Payer: Self-pay

## 2024-06-27 ENCOUNTER — Encounter (HOSPITAL_COMMUNITY)
Admission: RE | Disposition: A | Discharge status: Home / Self Care | Payer: Self-pay | Source: Home / Self Care | Attending: Vascular Surgery

## 2024-06-27 SURGERY — A/V FISTULAGRAM
Anesthesia: LOCAL | Site: Arm Upper | Laterality: Left

## 2024-06-27 NOTE — H&P (View-Only) (Signed)
 [facility] Consult    Reason for Consult: Fistula malfunction Requesting Physician: Dr. Melia MRN #:  [id number]  History of Present Illness: This is a 69 y.o. male with end-stage renal disease currently being dialyzed through a left arm brachiocephalic fistula which was placed in 2023.  The fistula has been working well except for some prolonged bleeding 8 the end of dialysis.  This is not every time, but has happened on occasion, more so recently.  He presents today to discuss options.  On exam, Anthony Owens was doing well.  He had no complaints.  Denied symptoms of steal syndrome in the hand. Denies spontaneous bleeding events, denies issues with dialysis runs.  Past Medical History:  Diagnosis Date   Anemia 11/25/2010   duodenal ulcer, s/p transfusion   Asthma    Back pain    resolved   Chronic kidney disease    Diabetes mellitus    Dyslipidemia    Erectile dysfunction    ESRD (end stage renal disease) (HCC)    Hemodialysis [address] [facility]   H. pylori infection 03/26/2012   seen on EGD with gastritis, duodenitis and duodenal ulcer   Hiatal hernia 03/26/2012   Hypertension    Orbital fracture (HCC) 11/25/2010   left (due to fall)   PUD (peptic ulcer disease)    11/2010; duodenal ulcer    Past Surgical History:  Procedure Laterality Date   AV FISTULA PLACEMENT Left 08/08/2021   Procedure: LEFT ARM BRACHIOCEPHALIC ARTERIOVENOUS (AV) FISTULA CREATION;  Surgeon: Gretta Lonni PARAS, MD;  Location: [facility] OR;  Service: Vascular;  Laterality: Left;   COLONOSCOPY     ESOPHAGOGASTRODUODENOSCOPY  04/01/2012    Allergies[1]  Prior to Admission medications  Medication Sig Start Date End Date Taking? Authorizing Provider  albuterol  (VENTOLIN  HFA) 108 (90 Base) MCG/ACT inhaler Inhale 1-2 puffs into the lungs every 6 (six) hours as needed for wheezing or shortness of breath.    [provider]  amLODipine (NORVASC) 10 MG tablet Take 10 mg by mouth in the morning.    [provider]  aspirin  EC 81 MG tablet Take 81 mg by mouth daily. Swallow whole.    [provider]  HYDROcodone -acetaminophen  (NORCO) 5-325 MG tablet Take 1 tablet by mouth every 6 (six) hours as needed for moderate pain. 08/08/21   Rhyne, Samantha J, PA-C  sodium bicarbonate 650 MG tablet Take 650 mg by mouth in the morning and at bedtime. Patient not taking: Reported on 09/27/2021    [provider]    Social History   Socioeconomic History   Marital status: Single    Spouse name: Not on file   Number of children: 0   Years of education: Not on file   Highest education level: Not on file  Occupational History   Occupation: COOK    Employer: [employer]  Tobacco Use   Smoking status: Never   Smokeless tobacco: Never  Vaping Use   Vaping status: Never Used  Substance and Sexual Activity   Alcohol use: Yes    Alcohol/week: 4.0 standard drinks of alcohol    Types: 4 Cans of beer per week    Comment: occasionally   Drug use: Yes    Frequency: 7.0 times per week    Types: Other-see comments, Marijuana    Comment: Marijuana last time 08/05/21 CBD gummies 2 times a week   Sexual activity: Yes    Partners: Female  Other Topics Concern   Not on file  Social History Narrative   Lives with his sister. Passive tobacco exposure from his sister.  Cook at [employer]   Social Drivers of Health   Tobacco Use: Low Risk  (11/28/2021)   Received from [facility] visits prior to 08/26/2022.   Patient History    Smoking Tobacco Use: Never    Smokeless Tobacco Use: Never    Passive Exposure: Not on file  Financial Resource Strain: Not on file  Food Insecurity: Not on file  Transportation Needs: Not on file  Physical Activity: Not on file  Stress: Not on file  Social Connections: Not on file  Intimate Partner Violence: Not on file  Depression (PHQ2-9): Not on file  Alcohol Screen: Not on file  Housing: Not on file  Utilities:  Not on file  Health Literacy: Not on file    Family History  Problem Relation Age of Onset   Heart disease Mother    Alzheimer's disease Mother    Heart disease Father 74       MI   Diabetes Sister    Diabetes Sister    Cancer Maternal Grandmother        male cancer   Colon cancer Neg Hx    Esophageal cancer Neg Hx    Rectal cancer Neg Hx    Stomach cancer Neg Hx    Stroke Neg Hx     ROS: Otherwise negative unless mentioned in HPI  Physical Examination  Vitals:   06/27/24 0806 06/27/24 0812  BP: 138/88 138/88  Pulse: 76 72  Resp: 12 16  Temp: (!) 97.5 F (36.4 C)   SpO2: 98% 98%   There is no height or weight on file to calculate BMI.  General:  WDWN in NAD Gait: Not observed HENT: WNL, normocephalic Pulmonary: normal non-labored breathing, without Rales, rhonchi,  wheezing Cardiac: regular Abdomen:  soft, NT/ND, no masses Skin: without rashes Vascular Exam/Pulses: 1+ Radial Extremities: without ischemic changes, without Gangrene , without cellulitis; without open wounds;  Musculoskeletal: no muscle wasting or atrophy  Neurologic: A&O X 3;  No focal weakness or paresthesias are detected; speech is fluent/normal Psychiatric:  The pt has Normal affect. Lymph:  Unremarkable  CBC    Component Value Date/Time   WBC 4.7 02/22/2021 1453   WBC 5.3 06/09/2020 2020   RBC 3.64 (L) 02/22/2021 1453   HGB 11.2 (L) 08/08/2021 0623   HGB 10.7 (L) 02/22/2021 1453   HCT 33.0 (L) 08/08/2021 0623   PLT 161 02/22/2021 1453   MCV 94.0 02/22/2021 1453   MCH 29.4 02/22/2021 1453   MCHC 31.3 02/22/2021 1453   RDW 13.9 02/22/2021 1453   LYMPHSABS 1.3 02/22/2021 1453   MONOABS 0.6 02/22/2021 1453   EOSABS 0.4 02/22/2021 1453   BASOSABS 0.0 02/22/2021 1453    BMET    Component Value Date/Time   NA 142 08/08/2021 0623   K 4.0 08/08/2021 0623   CL 106 08/08/2021 0623   CO2 20 (L) 02/22/2021 1453   GLUCOSE 89 08/08/2021 0623   BUN 44 (H) 08/08/2021 0623   CREATININE  8.10 (H) 08/08/2021 0623   CREATININE 5.96 (HH) 02/22/2021 1453   CREATININE 2.37 (H) 09/18/2013 1143   CALCIUM 7.7 (L) 02/22/2021 1453   GFRNONAA 10 (L) 02/22/2021 1453   GFRAA 54 (L) 03/26/2015 0907    COAGS: Lab Results  Component Value Date   INR 1.02 12/07/2010   INR 1.01 12/06/2010     ASSESSMENT/PLAN: This is a  69 y.o. male with history of end-stage renal disease currently being dialyzed through a left arm brachiocephalic fistula.  The fistula has had a few episodes of prolonged bleeding prompting referral to vascular surgery due to concern for central stenosis.   The fistula has a nice thrill on exam, but is tortuous which can lead to some areas of stenosis.  After discussing risks and benefits of left arm fistulogram in an effort to define improve flow to limit decannulation bleeding, Anthony Owens elected to proceed   Cecilie Heidel E Jessicia Napolitano MD MS [facility] [phone] 06/27/2024  8:16 AM     [1] No Known Allergies  "

## 2024-06-27 NOTE — H&P (Signed)
 [facility] Consult    Reason for Consult: Fistula malfunction Requesting Physician: Dr. Melia MRN #:  [id number]  History of Present Illness: This is a 69 y.o. male with end-stage renal disease currently being dialyzed through a left arm brachiocephalic fistula which was placed in 2023.  The fistula has been working well except for some prolonged bleeding 8 the end of dialysis.  This is not every time, but has happened on occasion, more so recently.  He presents today to discuss options.  On exam, Anthony Owens was doing well.  He had no complaints.  Denied symptoms of steal syndrome in the hand. Denies spontaneous bleeding events, denies issues with dialysis runs.  Past Medical History:  Diagnosis Date   Anemia 11/25/2010   duodenal ulcer, s/p transfusion   Asthma    Back pain    resolved   Chronic kidney disease    Diabetes mellitus    Dyslipidemia    Erectile dysfunction    ESRD (end stage renal disease) (HCC)    Hemodialysis [address] [facility]   H. pylori infection 03/26/2012   seen on EGD with gastritis, duodenitis and duodenal ulcer   Hiatal hernia 03/26/2012   Hypertension    Orbital fracture (HCC) 11/25/2010   left (due to fall)   PUD (peptic ulcer disease)    11/2010; duodenal ulcer    Past Surgical History:  Procedure Laterality Date   AV FISTULA PLACEMENT Left 08/08/2021   Procedure: LEFT ARM BRACHIOCEPHALIC ARTERIOVENOUS (AV) FISTULA CREATION;  Surgeon: Gretta Lonni PARAS, MD;  Location: [facility] OR;  Service: Vascular;  Laterality: Left;   COLONOSCOPY     ESOPHAGOGASTRODUODENOSCOPY  04/01/2012    Allergies[1]  Prior to Admission medications  Medication Sig Start Date End Date Taking? Authorizing Provider  albuterol  (VENTOLIN  HFA) 108 (90 Base) MCG/ACT inhaler Inhale 1-2 puffs into the lungs every 6 (six) hours as needed for wheezing or shortness of breath.    [provider]  amLODipine (NORVASC) 10 MG tablet Take 10 mg by mouth in the morning.    [provider]  aspirin  EC 81 MG tablet Take 81 mg by mouth daily. Swallow whole.    [provider]  HYDROcodone -acetaminophen  (NORCO) 5-325 MG tablet Take 1 tablet by mouth every 6 (six) hours as needed for moderate pain. 08/08/21   Rhyne, Samantha J, PA-C  sodium bicarbonate 650 MG tablet Take 650 mg by mouth in the morning and at bedtime. Patient not taking: Reported on 09/27/2021    [provider]    Social History   Socioeconomic History   Marital status: Single    Spouse name: Not on file   Number of children: 0   Years of education: Not on file   Highest education level: Not on file  Occupational History   Occupation: COOK    Employer: [employer]  Tobacco Use   Smoking status: Never   Smokeless tobacco: Never  Vaping Use   Vaping status: Never Used  Substance and Sexual Activity   Alcohol use: Yes    Alcohol/week: 4.0 standard drinks of alcohol    Types: 4 Cans of beer per week    Comment: occasionally   Drug use: Yes    Frequency: 7.0 times per week    Types: Other-see comments, Marijuana    Comment: Marijuana last time 08/05/21 CBD gummies 2 times a week   Sexual activity: Yes    Partners: Female  Other Topics Concern   Not on file  Social History Narrative   Lives with his sister. Passive tobacco exposure from his sister.  Cook at [employer]   Social Drivers of Health   Tobacco Use: Low Risk  (11/28/2021)   Received from [facility] visits prior to 08/26/2022.   Patient History    Smoking Tobacco Use: Never    Smokeless Tobacco Use: Never    Passive Exposure: Not on file  Financial Resource Strain: Not on file  Food Insecurity: Not on file  Transportation Needs: Not on file  Physical Activity: Not on file  Stress: Not on file  Social Connections: Not on file  Intimate Partner Violence: Not on file  Depression (PHQ2-9): Not on file  Alcohol Screen: Not on file  Housing: Not on file  Utilities:  Not on file  Health Literacy: Not on file    Family History  Problem Relation Age of Onset   Heart disease Mother    Alzheimer's disease Mother    Heart disease Father 74       MI   Diabetes Sister    Diabetes Sister    Cancer Maternal Grandmother        male cancer   Colon cancer Neg Hx    Esophageal cancer Neg Hx    Rectal cancer Neg Hx    Stomach cancer Neg Hx    Stroke Neg Hx     ROS: Otherwise negative unless mentioned in HPI  Physical Examination  Vitals:   06/27/24 0806 06/27/24 0812  BP: 138/88 138/88  Pulse: 76 72  Resp: 12 16  Temp: (!) 97.5 F (36.4 C)   SpO2: 98% 98%   There is no height or weight on file to calculate BMI.  General:  WDWN in NAD Gait: Not observed HENT: WNL, normocephalic Pulmonary: normal non-labored breathing, without Rales, rhonchi,  wheezing Cardiac: regular Abdomen:  soft, NT/ND, no masses Skin: without rashes Vascular Exam/Pulses: 1+ Radial Extremities: without ischemic changes, without Gangrene , without cellulitis; without open wounds;  Musculoskeletal: no muscle wasting or atrophy  Neurologic: A&O X 3;  No focal weakness or paresthesias are detected; speech is fluent/normal Psychiatric:  The pt has Normal affect. Lymph:  Unremarkable  CBC    Component Value Date/Time   WBC 4.7 02/22/2021 1453   WBC 5.3 06/09/2020 2020   RBC 3.64 (L) 02/22/2021 1453   HGB 11.2 (L) 08/08/2021 0623   HGB 10.7 (L) 02/22/2021 1453   HCT 33.0 (L) 08/08/2021 0623   PLT 161 02/22/2021 1453   MCV 94.0 02/22/2021 1453   MCH 29.4 02/22/2021 1453   MCHC 31.3 02/22/2021 1453   RDW 13.9 02/22/2021 1453   LYMPHSABS 1.3 02/22/2021 1453   MONOABS 0.6 02/22/2021 1453   EOSABS 0.4 02/22/2021 1453   BASOSABS 0.0 02/22/2021 1453    BMET    Component Value Date/Time   NA 142 08/08/2021 0623   K 4.0 08/08/2021 0623   CL 106 08/08/2021 0623   CO2 20 (L) 02/22/2021 1453   GLUCOSE 89 08/08/2021 0623   BUN 44 (H) 08/08/2021 0623   CREATININE  8.10 (H) 08/08/2021 0623   CREATININE 5.96 (HH) 02/22/2021 1453   CREATININE 2.37 (H) 09/18/2013 1143   CALCIUM 7.7 (L) 02/22/2021 1453   GFRNONAA 10 (L) 02/22/2021 1453   GFRAA 54 (L) 03/26/2015 0907    COAGS: Lab Results  Component Value Date   INR 1.02 12/07/2010   INR 1.01 12/06/2010     ASSESSMENT/PLAN: This is a  69 y.o. male with history of end-stage renal disease currently being dialyzed through a left arm brachiocephalic fistula.  The fistula has had a few episodes of prolonged bleeding prompting referral to vascular surgery due to concern for central stenosis.   The fistula has a nice thrill on exam, but is tortuous which can lead to some areas of stenosis.  After discussing risks and benefits of left arm fistulogram in an effort to define improve flow to limit decannulation bleeding, Anthony Owens elected to proceed   Cecilie Heidel E Jessicia Napolitano MD [facility] [phone] 06/27/2024  8:16 AM     [1] No Known Allergies  "

## 2024-07-01 ENCOUNTER — Encounter (HOSPITAL_COMMUNITY): Payer: Self-pay

## 2024-07-03 ENCOUNTER — Ambulatory Visit (HOSPITAL_COMMUNITY)
Admission: RE | Admit: 2024-07-03 | Discharge: 2024-07-03 | Disposition: A | Discharge status: Home / Self Care | Attending: Surgery | Admitting: Surgery

## 2024-07-03 ENCOUNTER — Encounter (HOSPITAL_COMMUNITY)
Admission: RE | Disposition: A | Discharge status: Home / Self Care | Payer: Self-pay | Source: Home / Self Care | Attending: Surgery

## 2024-07-03 ENCOUNTER — Other Ambulatory Visit: Payer: Self-pay

## 2024-07-03 ENCOUNTER — Encounter (HOSPITAL_COMMUNITY): Payer: Self-pay | Admitting: Surgery

## 2024-07-03 DIAGNOSIS — E1122 Type 2 diabetes mellitus with diabetic chronic kidney disease: Secondary | ICD-10-CM | POA: Insufficient documentation

## 2024-07-03 DIAGNOSIS — Z7982 Long term (current) use of aspirin: Secondary | ICD-10-CM | POA: Diagnosis not present

## 2024-07-03 DIAGNOSIS — Y832 Surgical operation with anastomosis, bypass or graft as the cause of abnormal reaction of the patient, or of later complication, without mention of misadventure at the time of the procedure: Secondary | ICD-10-CM | POA: Insufficient documentation

## 2024-07-03 DIAGNOSIS — Z79899 Other long term (current) drug therapy: Secondary | ICD-10-CM | POA: Insufficient documentation

## 2024-07-03 DIAGNOSIS — Z992 Dependence on renal dialysis: Secondary | ICD-10-CM | POA: Insufficient documentation

## 2024-07-03 DIAGNOSIS — T82858A Stenosis of vascular prosthetic devices, implants and grafts, initial encounter: Secondary | ICD-10-CM | POA: Diagnosis present

## 2024-07-03 DIAGNOSIS — I120 Hypertensive chronic kidney disease with stage 5 chronic kidney disease or end stage renal disease: Secondary | ICD-10-CM | POA: Diagnosis not present

## 2024-07-03 DIAGNOSIS — N186 End stage renal disease: Secondary | ICD-10-CM | POA: Diagnosis not present

## 2024-07-03 HISTORY — PX: A/V FISTULAGRAM: CATH118298

## 2024-07-03 HISTORY — PX: VENOUS ANGIOPLASTY: CATH118376

## 2024-07-03 SURGERY — A/V FISTULAGRAM
Anesthesia: LOCAL | Site: Arm Upper | Laterality: Left

## 2024-07-03 MED ORDER — HEPARIN (PORCINE) IN NACL 1000-0.9 UT/500ML-% IV SOLN
INTRAVENOUS | Status: DC | PRN
  Administered 2024-07-03: 500 mL

## 2024-07-03 MED ORDER — IODIXANOL 320 MG/ML IV SOLN
INTRAVENOUS | Status: DC | PRN
  Administered 2024-07-03: 20 mL

## 2024-07-03 MED ORDER — LIDOCAINE HCL (PF) 1 % IJ SOLN
INTRAMUSCULAR | Status: AC
  Filled 2024-07-03: qty 30

## 2024-07-03 MED ORDER — LIDOCAINE HCL (PF) 1 % IJ SOLN
INTRAMUSCULAR | Status: DC | PRN
  Administered 2024-07-03: 2 mL via INTRADERMAL

## 2024-07-03 SURGICAL SUPPLY — 9 items
BALLOON MUSTANG 8.0X40 75 (BALLOONS) IMPLANT
DEVICE INFLATION ENCORE 26 (MISCELLANEOUS) IMPLANT
GLIDEWIRE ADV .035X180CM (WIRE) IMPLANT
KIT MICROPUNCTURE NIT STIFF (SHEATH) IMPLANT
KIT PV (KITS) ×2 IMPLANT
SHEATH PINNACLE R/O II 6F 4CM (SHEATH) IMPLANT
SHEATH PROBE COVER 6X72 (BAG) IMPLANT
TRAY PV CATH (CUSTOM PROCEDURE TRAY) ×2 IMPLANT
TUBING CIL FLEX 10 FLL-RA (TUBING) IMPLANT

## 2024-07-03 NOTE — Op Note (Signed)
" ° ° °  Patient name: Anthony Owens. Anthony Owens MRN: [id number] DOB: 1955/09/10 Sex: male  07/03/2024 Pre-operative Diagnosis: ESRD Post-operative diagnosis:  Same Surgeon:  Malvina New Procedure Performed:  1.  Ultrasound-guided access, left cephalic vein  2.  Fistulogram  3.  Balloon venoplasty, left cephalic vein (peripheral)  4.  Preoperative assessment (00786)    Indications: This is a 69 year old gentleman with bleeding after dialysis who comes in today for fistulogram  Procedure:  The patient was identified in the holding area and taken to room 8.  The patient was then placed supine on the table and prepped and draped in the usual sterile fashion.  A time out was called. Ultrasound was used to evaluate the fistula.  The vein was patent and compressible.  A digital ultrasound image was acquired.  The fistula was then accessed under ultrasound guidance using a micropuncture needle.  An 018 wire was then asvanced without resistance and a micropuncture sheath was placed.  Contrast injections were then performed through the sheath.  Findings: No evidence of central venous stenosis.  The arteriovenous anastomosis is widely patent.  The fistula is fairly tortuous.  There is 1 area in the midportion of the fistula where there appeared to be luminal narrowing of greater than 60%   Intervention: After the above images were acquired the decision made to proceed with intervention.  Over a Glidewire manage a 6 French sheath was placed.  I selected an 8 x 40 balloon to treat the above lesion.  Completion imaging showed slight improvement in the appearance of the vein.  The sheath was then removed and the access closed with a Monocryl  Impression:  #1  Successful balloon venoplasty of a mid fistula stenosis.  I suspect that the issues with bleeding are from cannulation given the tortuosity of his fistula.  If he continues to have issues, he should be referred to the office for consideration of fistula revision  #2   Access is amenable to future percutaneous interventions and is ready for use    V. Malvina New, M.D., [facility] [place] Office: [phone] Pager:  [phone]  "

## 2024-07-03 NOTE — Interval H&P Note (Signed)
 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  The various methods of treatment have been discussed with the patient and family. After consideration of risks, benefits and other options for treatment, the patient has consented to  Procedures: A/V Fistulagram (Left) as a surgical intervention.  The patient's history has been reviewed, patient examined, no change in status, stable for surgery.  I have reviewed the patient's chart and labs.  Questions were answered to the patient's satisfaction.     Malvina New

## 2024-08-01 ENCOUNTER — Encounter (HOSPITAL_COMMUNITY): Payer: Self-pay | Admitting: Vascular Surgery
